# Patient Record
Sex: Male | Born: 1965 | Hispanic: Yes | Marital: Married | Smoking: Never smoker
Health system: Southern US, Community
[De-identification: ages and names within clinical notes are randomized; demographics above are authoritative.]

## PROBLEM LIST (undated history)

## (undated) DIAGNOSIS — E785 Hyperlipidemia, unspecified: Secondary | ICD-10-CM

## (undated) DIAGNOSIS — K449 Diaphragmatic hernia without obstruction or gangrene: Secondary | ICD-10-CM

## (undated) DIAGNOSIS — S62109A Fracture of unspecified carpal bone, unspecified wrist, initial encounter for closed fracture: Secondary | ICD-10-CM

## (undated) HISTORY — DX: Fracture of unspecified carpal bone, unspecified wrist, initial encounter for closed fracture: S62.109A

## (undated) HISTORY — PX: ESOPHAGOGASTRODUODENOSCOPY: SHX1529

## (undated) HISTORY — DX: Diaphragmatic hernia without obstruction or gangrene: K44.9

## (undated) HISTORY — PX: HAIR TRANSPLANT: SHX1719

---

## 2002-11-13 ENCOUNTER — Encounter: Payer: Self-pay | Admitting: Family Medicine

## 2007-07-12 ENCOUNTER — Ambulatory Visit: Payer: Self-pay | Admitting: Family Medicine

## 2007-07-12 DIAGNOSIS — R030 Elevated blood-pressure reading, without diagnosis of hypertension: Secondary | ICD-10-CM | POA: Insufficient documentation

## 2007-07-12 DIAGNOSIS — M25569 Pain in unspecified knee: Secondary | ICD-10-CM | POA: Insufficient documentation

## 2007-07-12 DIAGNOSIS — E7800 Pure hypercholesterolemia, unspecified: Secondary | ICD-10-CM | POA: Insufficient documentation

## 2007-07-19 ENCOUNTER — Encounter: Payer: Self-pay | Admitting: Family Medicine

## 2007-07-20 ENCOUNTER — Encounter: Payer: Self-pay | Admitting: Family Medicine

## 2007-07-20 LAB — CONVERTED CEMR LAB
ALT: 37 units/L (ref 0–53)
AST: 27 units/L (ref 0–37)
Albumin: 4.3 g/dL (ref 3.5–5.2)
Alkaline Phosphatase: 77 units/L (ref 39–117)
BUN: 9 mg/dL (ref 6–23)
CO2: 25 meq/L (ref 19–32)
Calcium: 9.3 mg/dL (ref 8.4–10.5)
Chloride: 103 meq/L (ref 96–112)
Creatinine, Ser: 0.8 mg/dL (ref 0.40–1.50)
HDL goal, serum: 40 mg/dL
LDL Cholesterol: 155 mg/dL — ABNORMAL HIGH (ref 0–99)
LDL Goal: 160 mg/dL
TSH: 1.164 microintl units/mL (ref 0.350–5.50)
Total CHOL/HDL Ratio: 5
Triglycerides: 160 mg/dL — ABNORMAL HIGH (ref <150)

## 2007-07-21 ENCOUNTER — Encounter: Admission: RE | Admit: 2007-07-21 | Discharge: 2007-07-21 | Payer: Self-pay | Admitting: Sports Medicine

## 2007-07-21 ENCOUNTER — Telehealth: Payer: Self-pay | Admitting: Family Medicine

## 2007-09-26 ENCOUNTER — Ambulatory Visit: Payer: Self-pay | Admitting: Family Medicine

## 2007-09-26 DIAGNOSIS — R7301 Impaired fasting glucose: Secondary | ICD-10-CM | POA: Insufficient documentation

## 2007-09-27 LAB — CONVERTED CEMR LAB
Albumin: 4.5 g/dL (ref 3.5–5.2)
Alkaline Phosphatase: 64 units/L (ref 39–117)
HDL: 46 mg/dL (ref >39)
Total Bilirubin: 0.3 mg/dL (ref 0.3–1.2)
Total CHOL/HDL Ratio: 3.5

## 2008-04-20 ENCOUNTER — Encounter: Payer: Self-pay | Admitting: Family Medicine

## 2008-05-10 ENCOUNTER — Encounter: Payer: Self-pay | Admitting: Family Medicine

## 2008-09-28 ENCOUNTER — Ambulatory Visit: Payer: Self-pay | Admitting: Family Medicine

## 2008-09-28 DIAGNOSIS — K2970 Gastritis, unspecified, without bleeding: Secondary | ICD-10-CM | POA: Insufficient documentation

## 2008-09-28 DIAGNOSIS — M25519 Pain in unspecified shoulder: Secondary | ICD-10-CM | POA: Insufficient documentation

## 2008-09-28 DIAGNOSIS — K2990 Gastroduodenitis, unspecified, without bleeding: Secondary | ICD-10-CM

## 2008-09-28 DIAGNOSIS — H6120 Impacted cerumen, unspecified ear: Secondary | ICD-10-CM | POA: Insufficient documentation

## 2008-09-29 ENCOUNTER — Encounter: Payer: Self-pay | Admitting: Family Medicine

## 2008-10-01 LAB — CONVERTED CEMR LAB
ALT: 14 units/L (ref 0–53)
Albumin: 4.6 g/dL (ref 3.5–5.2)
Alkaline Phosphatase: 71 units/L (ref 39–117)
BUN: 11 mg/dL (ref 6–23)
Calcium: 9.2 mg/dL (ref 8.4–10.5)
Cholesterol: 140 mg/dL (ref 0–200)
Creatinine, Ser: 0.81 mg/dL (ref 0.40–1.50)
Glucose, Bld: 99 mg/dL (ref 70–99)
LDL Cholesterol: 67 mg/dL (ref 0–99)
Potassium: 4.6 meq/L (ref 3.5–5.3)
Total Bilirubin: 0.6 mg/dL (ref 0.3–1.2)
Total Protein: 6.9 g/dL (ref 6.0–8.3)
Triglycerides: 147 mg/dL (ref <150)
VLDL: 29 mg/dL (ref 0–40)

## 2008-10-10 ENCOUNTER — Telehealth: Payer: Self-pay | Admitting: Family Medicine

## 2008-12-28 DIAGNOSIS — S62109A Fracture of unspecified carpal bone, unspecified wrist, initial encounter for closed fracture: Secondary | ICD-10-CM

## 2008-12-28 HISTORY — DX: Fracture of unspecified carpal bone, unspecified wrist, initial encounter for closed fracture: S62.109A

## 2009-07-04 ENCOUNTER — Ambulatory Visit: Payer: Self-pay | Admitting: Family Medicine

## 2009-07-05 LAB — CONVERTED CEMR LAB
ALT: 17 units/L (ref 0–53)
AST: 13 units/L (ref 0–37)
CO2: 27 meq/L (ref 19–32)
Calcium: 10 mg/dL (ref 8.4–10.5)
Cholesterol: 157 mg/dL (ref 0–200)
Creatinine, Ser: 0.86 mg/dL (ref 0.40–1.50)
HDL: 49 mg/dL (ref >39)
Potassium: 4.4 meq/L (ref 3.5–5.3)
Triglycerides: 112 mg/dL (ref <150)

## 2009-08-22 ENCOUNTER — Telehealth: Payer: Self-pay | Admitting: Family Medicine

## 2009-08-23 ENCOUNTER — Encounter: Payer: Self-pay | Admitting: Family Medicine

## 2009-08-26 LAB — CONVERTED CEMR LAB
AST: 14 units/L (ref 0–37)
Albumin: 4.7 g/dL (ref 3.5–5.2)
Alkaline Phosphatase: 60 units/L (ref 39–117)
CO2: 24 meq/L (ref 19–32)
Calcium: 9.8 mg/dL (ref 8.4–10.5)
Chloride: 102 meq/L (ref 96–112)
Creatinine, Ser: 0.87 mg/dL (ref 0.40–1.50)
Potassium: 4.3 meq/L (ref 3.5–5.3)
Sodium: 139 meq/L (ref 135–145)
Total CK: 87 units/L (ref 7–232)

## 2009-09-11 ENCOUNTER — Telehealth: Payer: Self-pay | Admitting: Family Medicine

## 2009-09-13 ENCOUNTER — Ambulatory Visit: Payer: Self-pay | Admitting: Family Medicine

## 2009-12-01 ENCOUNTER — Emergency Department (HOSPITAL_BASED_OUTPATIENT_CLINIC_OR_DEPARTMENT_OTHER): Admission: EM | Admit: 2009-12-01 | Discharge: 2009-12-01 | Payer: Self-pay | Admitting: Emergency Medicine

## 2009-12-01 ENCOUNTER — Ambulatory Visit: Payer: Self-pay | Admitting: Diagnostic Radiology

## 2010-01-24 ENCOUNTER — Encounter: Admission: RE | Admit: 2010-01-24 | Discharge: 2010-03-20 | Payer: Self-pay | Admitting: Orthopaedic Surgery

## 2010-02-26 ENCOUNTER — Ambulatory Visit: Payer: Self-pay | Admitting: Diagnostic Radiology

## 2010-02-26 ENCOUNTER — Ambulatory Visit (HOSPITAL_BASED_OUTPATIENT_CLINIC_OR_DEPARTMENT_OTHER): Admission: RE | Admit: 2010-02-26 | Discharge: 2010-02-26 | Payer: Self-pay | Admitting: Orthopaedic Surgery

## 2010-04-29 ENCOUNTER — Encounter: Payer: Self-pay | Admitting: Family Medicine

## 2010-05-17 ENCOUNTER — Encounter: Payer: Self-pay | Admitting: Family Medicine

## 2010-08-08 ENCOUNTER — Ambulatory Visit: Payer: Self-pay | Admitting: Family Medicine

## 2010-08-08 DIAGNOSIS — R972 Elevated prostate specific antigen [PSA]: Secondary | ICD-10-CM | POA: Insufficient documentation

## 2010-08-08 DIAGNOSIS — R5383 Other fatigue: Secondary | ICD-10-CM | POA: Insufficient documentation

## 2010-08-08 DIAGNOSIS — R5381 Other malaise: Secondary | ICD-10-CM | POA: Insufficient documentation

## 2010-08-08 LAB — CONVERTED CEMR LAB: Blood Glucose, AC Bkfst: 129 mg/dL

## 2010-08-11 LAB — CONVERTED CEMR LAB
Alkaline Phosphatase: 72 units/L (ref 39–117)
BUN: 12 mg/dL (ref 6–23)
Calcium: 10 mg/dL (ref 8.4–10.5)
Chloride: 101 meq/L (ref 96–112)
Cholesterol: 137 mg/dL (ref 0–200)
HDL: 46 mg/dL (ref >39)
LDL Cholesterol: 75 mg/dL (ref 0–99)
PSA: 2.88 ng/mL (ref 0.10–4.00)
Potassium: 4.8 meq/L (ref 3.5–5.3)
Sodium: 141 meq/L (ref 135–145)
Triglycerides: 78 mg/dL (ref <150)
Vitamin B-12: 485 pg/mL (ref 211–911)

## 2011-01-26 ENCOUNTER — Telehealth: Payer: Self-pay | Admitting: Family Medicine

## 2011-01-28 NOTE — Assessment & Plan Note (Signed)
Summary: DISCUSS sugar, lipids, joints   Vital Signs:  Patient profile:   45 year old male Height:      66.5 inches Weight:      146 pounds BMI:     23.30 Pulse rate:   60 / minute BP sitting:   114 / 80  (left arm) Cuff size:   regular  Vitals Entered By: Avon Gully CMA, Duncan Dull) (August 08, 2010 8:07 AM) CC: Discuss labs, pt sugar has been fluctuating between 90-108,wants to switch Lipitor to a generic   Primary Care Provider:  Linford Arnold, C  CC:  Discuss labs, pt sugar has been fluctuating between 90-108, and wants to switch Lipitor to a generic.  History of Present Illness: Discuss labs, pt sugar has been fluctuating between 90-108,wants to switch Lipitor to a generic.  Hasn't been as active after his wrist fracture.    Did PT on his right shoulder and right wrist  after hs wrist fracture and after having an MRI of his shoulder. He will get the results sent to [place]. Still having pain in his shoulder but says he has fallen off on doing his exercises onhis shoulder.   Has PSA checked during a free screening at [facility] and his level was initially.    Current Medications (verified): 1)  Accuchek Meter .... Strips To Test 1 X  A Day. Dx: Insulin Resistance. 2)  Lancets .... Enought To Check Sugar Once A Day 3)  Lipitor 40 Mg Tabs (Atorvastatin Calcium) .... Take 1 Tablet By Mouth Once A Day At Bedtime  Allergies (verified): 1)  Simvastatin (Simvastatin)  Comments:  Nurse/Medical Assistant: The patient's medications and allergies were reviewed with the patient and were updated in the Medication and Allergy Lists. Avon Gully CMA, Duncan Dull) (August 08, 2010 8:08 AM)  Past History:  Past Medical History: Hiatal hernia.  fractured wrist 2010  Physical Exam  General:  Well-developed,well-nourished,in no acute distress; alert,appropriate and cooperative throughout examination Head:  Normocephalic and atraumatic without obvious abnormalities. No apparent alopecia or  balding. Lungs:  Normal respiratory effort, chest expands symmetrically. Lungs are clear to auscultation, no crackles or wheezes. Heart:  Normal rate and regular rhythm. S1 and S2 normal without gallop, murmur, click, rub or other extra sounds. Skin:  no rashes.   Psych:  Cognition and judgment appear intact. Alert and cooperative with normal attention span and concentration. 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  Still in the prediabetic range. Continue to monitor and recheck in one year.  Orders: Fingerstick (16109) Glucose, (CBG) (60454) T-Comprehensive Metabolic Panel (09811-91478) Hemoglobin A1C (83036)  Problem # 2:  HYPERCHOLESTEROLEMIA, PURE (ICD-272.0) Will give him coupon card for lipitor as well. Due to check levels.  His updated medication list for this problem includes:    Lipitor 40 Mg Tabs (Atorvastatin calcium) .[name]... Take 1 tablet by mouth once a day at bedtime  Orders: T-Lipid Profile (29562-13086) T-Comprehensive Metabolic Panel (57846-96295)  Problem # 3:  PSA, INCREASED (ICD-790.93) Will recheck an make sure stable. Had a screening done at [facility] with level of 3.2.  Orders: T-PSA (28413-24401)  Complete Medication List: 1)  Accuchek Meter  .... Strips to test 1 x  a day. dx: insulin resistance. 2)  Lancets  .... Enought to check sugar once a day 3)  Lipitor 40 Mg Tabs (Atorvastatin calcium) .... Take 1 tablet by mouth once a day at bedtime  Other  Orders: T-Vitamin B12 [phone]) T-Vitamin D (25-Hydroxy) ([phone])  Patient Instructions: 1)  Glucosamine chondroitin for one month.  2)  We will call you with your labs.  3)  Please schedule a follow-up appointment in 1 year.  Prescriptions: LIPITOR 40 MG TABS (ATORVASTATIN CALCIUM) Take 1 tablet by mouth once a day at bedtime  #30 x 3   Entered by:   Kathlene November   Authorized by:    Nani Gasser MD   Signed by:   Kathlene November on 08/08/2010   Method used:   Electronically to        [facility] [phone]* (retail)       [address] [place], [place]  [address]       Ph: [phone] or [phone]       Fax: [phone]   RxID:   [id number]   Laboratory Results   Blood Tests   Date/Time Received: 08/08/10 Date/Time Reported: 08/08/10  HGBA1C: 6.0%   (Normal Range: Non-Diabetic - 3-6%   Control Diabetic - 6-8%) CBG Fasting:: 129mg /dL

## 2011-01-28 NOTE — Letter (Signed)
Summary: Care Consideration Regarding Diabetes & HbA1C Monitoring/[employer]  Care Consideration Regarding Diabetes & HbA1C Monitoring/[employer]   Imported By: Lanelle Bal 06/26/2010 09:07:34  _____________________________________________________________________  External Attachment:    Type:   Image     Comment:   External Document

## 2011-01-28 NOTE — Letter (Signed)
Summary: Prostate Screening/[facility]  Prostate Screening/[facility]   Imported By: Lanelle Bal 08/18/2010 12:54:28  _____________________________________________________________________  External Attachment:    Type:   Image     Comment:   External Document

## 2011-01-28 NOTE — Letter (Signed)
Summary: [facility]   Imported By: Lanelle Bal 06/25/2010 11:58:53  _____________________________________________________________________  External Attachment:    Type:   Image     Comment:   External Document

## 2011-01-28 NOTE — Miscellaneous (Signed)
Summary: Hep B/[facility]  Hep B/[facility]   Imported By: Lanelle Bal 06/25/2010 11:59:37  _____________________________________________________________________  External Attachment:    Type:   Image     Comment:   External Document

## 2011-02-04 NOTE — Progress Notes (Signed)
Summary: Liptor questions  Phone Note Call from Patient Call back at Home Phone [phone]   Caller: Patient Call For: Nani Gasser MD Reason for Call: Talk to Nurse Details for Reason: medication question Summary of Call: was taking lipitor, ran out in december, [facility] approved use of generic.  pt states he has history of elevated sugars, but has not been diagnosed as diabetic.  Pt read on internet that pt's with diabetes should not use Lipitor. If OK to take pt would like 90-day rx sent to [facility]. Also, pt would like take a lower dose if possible.  Advised pt I would send note to Dr. Linford Arnold adn we would return his call later with answers. Initial call taken by: Francee Piccolo CMA Duncan Dull),  January 26, 2011 2:37 PM  Follow-up for Phone Call        Will try dropping dose to 20mg  and then recheck chol in 4 months to make sure well vcontrolled onthe lower dose. Also there is no contraindication to diabetes and lipitor. We have tons of diabetics on lipitor.  Follow-up by: Nani Gasser MD,  January 26, 2011 4:49 PM  Additional Follow-up for Phone Call Additional follow up Details #1::        pt advised of the above.  He voices good understandign and is agreeable. Additional Follow-up by: Francee Piccolo CMA (AAMA),  January 27, 2011 11:12 AM    New/Updated Medications: LIPITOR 20 MG TABS (ATORVASTATIN CALCIUM) Take 1 tablet by mouth once a day at bedtime . Generic please. Prescriptions: LIPITOR 20 MG TABS (ATORVASTATIN CALCIUM) Take 1 tablet by mouth once a day at bedtime . Generic please.  #90 x 1   Entered and Authorized by:   Nani Gasser MD   Signed by:   Nani Gasser MD on 01/26/2011   Method used:   Electronically to        [facility] (retail)             ,          Ph: [phone]       Fax: [phone]   RxID:   [id number]

## 2011-08-17 ENCOUNTER — Telehealth: Payer: Self-pay | Admitting: *Deleted

## 2011-08-17 DIAGNOSIS — R030 Elevated blood-pressure reading, without diagnosis of hypertension: Secondary | ICD-10-CM

## 2011-08-17 DIAGNOSIS — E7800 Pure hypercholesterolemia, unspecified: Secondary | ICD-10-CM

## 2011-08-17 DIAGNOSIS — R972 Elevated prostate specific antigen [PSA]: Secondary | ICD-10-CM

## 2011-08-17 DIAGNOSIS — E348 Other specified endocrine disorders: Secondary | ICD-10-CM

## 2011-08-17 NOTE — Telephone Encounter (Signed)
Faxed orders to lab. KJ LPN

## 2011-08-17 NOTE — Telephone Encounter (Signed)
Pt coming in to see you at end of week. Would like the lab order sent to lab for him to get routine labs, cholesterol, sugar, etc checked before he comes for the appt. Pt will go tomorrow and have these done

## 2011-08-17 NOTE — Telephone Encounter (Signed)
Labs slip printed. Ok to fax.

## 2011-08-18 LAB — LIPID PANEL
LDL Cholesterol: 82 mg/dL (ref 0–99)
Total CHOL/HDL Ratio: 3.1 Ratio
VLDL: 19 mg/dL (ref 0–40)

## 2011-08-18 LAB — PSA: PSA: 2.69 ng/mL (ref <=4.00)

## 2011-08-19 ENCOUNTER — Encounter: Payer: Self-pay | Admitting: Family Medicine

## 2011-08-19 ENCOUNTER — Telehealth: Payer: Self-pay | Admitting: Family Medicine

## 2011-08-19 LAB — COMPLETE METABOLIC PANEL WITH GFR
AST: 16 U/L (ref 0–37)
Albumin: 4.4 g/dL (ref 3.5–5.2)
Alkaline Phosphatase: 60 U/L (ref 39–117)
CO2: 26 mEq/L (ref 19–32)
Chloride: 104 mEq/L (ref 96–112)
GFR, Est African American: >60 mL/min (ref >60)
Glucose, Bld: 100 mg/dL — ABNORMAL HIGH (ref 70–99)
Total Bilirubin: 0.5 mg/dL (ref 0.3–1.2)
Total Protein: 6.6 g/dL (ref 6.0–8.3)

## 2011-08-19 NOTE — Telephone Encounter (Signed)
Left message on pt.'s vm.

## 2011-08-19 NOTE — Telephone Encounter (Signed)
Call pt: CMP looks good. Sugar is much better. It was 100. Was 111 before. Lipid panel was nl. PSA is nl.

## 2011-08-20 ENCOUNTER — Telehealth: Payer: Self-pay | Admitting: Family Medicine

## 2011-08-20 NOTE — Telephone Encounter (Signed)
Pt wants a copy of his recent lab results mailed to him. PLan:  Mailed George Newcomer, LPN Domingo Dimes

## 2011-08-21 ENCOUNTER — Ambulatory Visit (INDEPENDENT_AMBULATORY_CARE_PROVIDER_SITE_OTHER): Payer: BC Managed Care – PPO | Admitting: Family Medicine

## 2011-08-21 ENCOUNTER — Other Ambulatory Visit: Payer: Self-pay | Admitting: Family Medicine

## 2011-08-21 ENCOUNTER — Encounter: Payer: Self-pay | Admitting: Family Medicine

## 2011-08-21 VITALS — BP 123/76 | HR 75 | Wt 151.0 lb

## 2011-08-21 DIAGNOSIS — R5383 Other fatigue: Secondary | ICD-10-CM

## 2011-08-21 DIAGNOSIS — E7800 Pure hypercholesterolemia, unspecified: Secondary | ICD-10-CM

## 2011-08-21 DIAGNOSIS — R5381 Other malaise: Secondary | ICD-10-CM

## 2011-08-21 DIAGNOSIS — E348 Other specified endocrine disorders: Secondary | ICD-10-CM

## 2011-08-21 DIAGNOSIS — R030 Elevated blood-pressure reading, without diagnosis of hypertension: Secondary | ICD-10-CM

## 2011-08-21 LAB — VITAMIN B12: Vitamin B-12: 359 pg/mL (ref 211–911)

## 2011-08-21 MED ORDER — ROSUVASTATIN CALCIUM 5 MG PO TABS: 5.0000 mg | ORAL_TABLET | Freq: Every day | ORAL | Status: DC

## 2011-08-21 NOTE — Patient Instructions (Signed)
Recheck your cholesterol in about 2 months. Can call for the lab slip.

## 2011-08-21 NOTE — Assessment & Plan Note (Addendum)
Sugar at 100. Recheck at 1 yr. Much imprvoed.

## 2011-08-22 NOTE — Assessment & Plan Note (Signed)
BP looks great today. Congratulated him on his lifestyle changes.

## 2011-08-22 NOTE — Progress Notes (Signed)
  Subjective:    Patient ID: George Young, male    DOB: [birth year], 45 y.o.   MRN: [id number]  Hyperlipidemia This is a chronic problem. The problem is controlled. Pertinent negatives include no chest pain or shortness of breath. Current antihyperlipidemic treatment includes statins. The current treatment provides significant improvement of lipids. There are no compliance problems.  Risk factors for coronary artery disease include male sex.   Here to review his lab results and to check his BP which has been borderline elevated in teh past. Says he has really made some dietary changes but hasn't been working out as much lately. He has been more fatigued lately and says would like to have his irno and B12 checked. He says these have been borderline low in the past.    Review of Systems  Respiratory: Negative for shortness of breath.   Cardiovascular: Negative for chest pain.       Objective:   Physical Exam  Constitutional: He is oriented to person, place, and time. He appears well-developed and well-nourished.  HENT:  Head: Normocephalic and atraumatic.  Cardiovascular: Normal rate, regular rhythm and normal heart sounds.   Pulmonary/Chest: Effort normal and breath sounds normal.  Musculoskeletal: He exhibits no edema.  Neurological: He is alert and oriented to person, place, and time.  Skin: Skin is warm and dry.  Psychiatric: He has a normal mood and affect. His behavior is normal.          Assessment & Plan:

## 2011-08-22 NOTE — Assessment & Plan Note (Addendum)
REviewed his results. Improved. Will try dec dose to 5 mg and see if still well controlled.

## 2011-08-24 ENCOUNTER — Telehealth: Payer: Self-pay | Admitting: Family Medicine

## 2011-08-24 NOTE — Telephone Encounter (Signed)
Pt notified of results. Pt not currently taking Vitamin B12. Instructed pt to start OTC Vitamin B12 daily. Pt verbalized understanding.KJ LPN

## 2011-08-24 NOTE — Telephone Encounter (Signed)
Call pt: Vitamin B12 is normal but on the low end. Consider taking daily Vit B12. If already on it then let me know.

## 2011-09-16 ENCOUNTER — Telehealth: Payer: Self-pay | Admitting: *Deleted

## 2011-09-16 NOTE — Telephone Encounter (Signed)
Tell him I apologize. We had 10mg  of crestor in the computer so I calledin 5 mg of crestor. Yest we can try switching to 10 mg of lipitor. I will send a new rx to [facility]

## 2011-09-16 NOTE — Telephone Encounter (Signed)
Pt called and states he did receive rx from [facility] for 5 mg of Crestor. His dose was to be decreased since chol had improved however pt was taking 20 mg of Lipitor and was wondering if it was to be decreased to 10 mg since it was discussed that the dose would be decreased by half. Pt would rather have Lipitor because its cheaper than Crestor. Please advise

## 2011-09-17 MED ORDER — ATORVASTATIN CALCIUM 10 MG PO TABS: 10.0000 mg | ORAL_TABLET | Freq: Every day | ORAL | Status: DC

## 2011-09-17 NOTE — Telephone Encounter (Signed)
Pt notified and samples up front of Lipitor 10 mg

## 2011-09-24 ENCOUNTER — Telehealth: Payer: Self-pay | Admitting: *Deleted

## 2011-09-24 NOTE — Telephone Encounter (Signed)
The Lipitor will cost him 30.00 but he has not got it yet he told [facility] to hold it.

## 2011-09-24 NOTE — Telephone Encounter (Signed)
Pt calls and states that [facility] sent the Crestor to his home and he has a bill for it of 100.00. Says that you sent the wrong ed into them and it should have been Lipitor. [facility] did receive the Lipitor rx as well but pt told them to hold it until he talked with you because he does not want to pay for the Crestor when it was the wrong med sent and they will not accept the med back. Explained to pt that I would discuss with you and see what you wanted to do. Pt was nice about the situation but really does not want to pay for it since he did not order it and it is the wrong med. Call pt back at [phone]

## 2011-09-24 NOTE — Telephone Encounter (Signed)
Can you find out the price difference between what he paid for the crestor and the lipitor?  Maybe we can pay him back the difference. I can talk with Toniann Fail. He can still use the crestor since they won't take it back. Might as well, and that will last him 6 months.

## 2011-09-29 NOTE — Telephone Encounter (Signed)
Spoke with Toniann Fail about the situation and per her instruction informed pt to bring the bill to her and she would pay it due to it was our office mistake. Pt agrees and will bring bill

## 2011-11-20 ENCOUNTER — Emergency Department
Admission: EM | Admit: 2011-11-20 | Discharge: 2011-11-20 | Disposition: A | Discharge status: Home / Self Care | Payer: BC Managed Care – PPO | Source: Home / Self Care | Attending: Family Medicine | Admitting: Family Medicine

## 2011-11-20 ENCOUNTER — Encounter: Payer: Self-pay | Admitting: *Deleted

## 2011-11-20 DIAGNOSIS — J069 Acute upper respiratory infection, unspecified: Secondary | ICD-10-CM

## 2011-11-20 DIAGNOSIS — J029 Acute pharyngitis, unspecified: Secondary | ICD-10-CM

## 2011-11-20 HISTORY — DX: Hyperlipidemia, unspecified: E78.5

## 2011-11-20 MED ORDER — BENZONATATE 200 MG PO CAPS: 200.0000 mg | ORAL_CAPSULE | Freq: Every day | ORAL | Status: AC

## 2011-11-20 MED ORDER — AZITHROMYCIN 250 MG PO TABS: ORAL_TABLET | ORAL | Status: AC

## 2011-11-20 NOTE — ED Notes (Signed)
Pt c/o sore throat and head congestion x 5 days. He also c/o bilateral ear congestion. No fever. He has taken OTC cold/ flu med and mucinex with no relief.

## 2011-11-20 NOTE — ED Provider Notes (Signed)
History     CSN: [id number] Arrival date & time: 11/20/2011  8:39 AM   First MD Initiated Contact with Patient 11/20/11 [phone]      Chief Complaint  Patient presents with  . Sore Throat     HPI Comments: Patient complains of approximately 6 day history of gradually progressive URI symptoms beginning with a mild sore throat that has persisted, followed by progressive nasal congestion.  A cough started about 2 days ago.  Complains of fatigue but no myalgias.  Cough is now worse at night and generally non-productive during the day.  There has been no pleuritic pain, shortness of breath, or wheezes.   Patient is a 45 y.o. male presenting with URI. The history is provided by the patient.  URI The primary symptoms include fatigue, headaches, ear pain, sore throat and cough. Primary symptoms do not include fever, swollen glands, wheezing, abdominal pain, nausea, vomiting or myalgias. The current episode started 6 to 7 days ago. This is a new problem.  The headache is not associated with neck stiffness.  Symptoms associated with the illness include plugged ear sensation, congestion and rhinorrhea. The illness is not associated with chills, facial pain or sinus pressure. The following treatments were addressed: A decongestant was ineffective.    Past Medical History  Diagnosis Date  . Hiatal hernia   . Wrist fracture 2010  . Hyperlipemia     History reviewed. No pertinent past surgical history.  Family History  Problem Relation Age of Onset  . Hyperlipidemia Mother   . Heart disease Mother     Has pacemaker  . Irregular heart beat Mother     pacemaker  . Hypertension Father     History  Substance Use Topics  . Smoking status: Never Smoker   . Smokeless tobacco: Not on file  . Alcohol Use: Yes     2 per mth      Review of Systems  Constitutional: Positive for activity change and fatigue. Negative for fever, chills and appetite change.  HENT: Positive for ear pain,  congestion, sore throat, rhinorrhea and postnasal drip. Negative for facial swelling, neck stiffness, sinus pressure and ear discharge.   Eyes: Negative.   Respiratory: Positive for cough. Negative for shortness of breath and wheezing.   Cardiovascular: Negative.   Gastrointestinal: Negative.  Negative for nausea, vomiting and abdominal pain.  Genitourinary: Negative.   Musculoskeletal: Negative.  Negative for myalgias.  Skin: Negative.   Neurological: Positive for headaches.    Allergies  Simvastatin  Home Medications   Current Outpatient Rx  Name Route Sig Dispense Refill  . ACCU-CHEK FASTCLIX LANCETS MISC       . ATORVASTATIN CALCIUM 10 MG PO TABS Oral Take 1 tablet (10 mg total) by mouth daily. 90 tablet 0  . AZITHROMYCIN 250 MG PO TABS  Take 2 tabs today; then begin one tab once daily for 4 more days. (Rx void after 11/27/11) 6 each 0  . BENZONATATE 200 MG PO CAPS Oral Take 1 capsule (200 mg total) by mouth at bedtime. Take as needed for cough 12 capsule 0  . ACCU-CHEK ADVANTAGE DIABETES KIT  Use as instructed       BP 134/86  Pulse 76  Temp(Src) 98.3 F (36.8 C) (Oral)  Resp 18  Ht 5\' 6"  (1.676 m)  Wt 153 lb 8 oz (69.627 kg)  BMI 24.78 kg/m2  SpO2 99%  Physical Exam  Nursing note and vitals reviewed. Constitutional: He is oriented to person, place,  and time. He appears well-developed and well-nourished. No distress.  HENT:  Head: Normocephalic.  Right Ear: External ear normal.  Left Ear: External ear normal.  Nose: Nose normal.  Mouth/Throat: Oropharynx is clear and moist. No oropharyngeal exudate.  Eyes: Conjunctivae and EOM are normal. Pupils are equal, round, and reactive to light. Right eye exhibits no discharge. Left eye exhibits no discharge.  Neck: Normal range of motion. Neck supple.       Shotty posterior cervical nodes are tender.   Cardiovascular: Normal rate, regular rhythm and normal heart sounds.   Pulmonary/Chest: Effort normal and breath sounds  normal. No stridor. No respiratory distress. He has no wheezes. He has no rales. He exhibits no tenderness.  Abdominal: Soft. Bowel sounds are normal. He exhibits no distension. There is no tenderness.  Musculoskeletal: He exhibits no edema.  Lymphadenopathy:    He has cervical adenopathy.  Neurological: He is alert and oriented to person, place, and time.  Skin: Skin is warm and dry. He is not diaphoretic.    ED Course  Procedures none   Labs Reviewed  POCT RAPID STREP A (OFFICE) negative  STREP A DNA PROBE pending      1. Acute upper respiratory infections of unspecified site   2. Acute pharyngitis       MDM  No evidence bacterial infection today.  Throat culture pending.  Treat symptomatically for now: Take Mucinex D (guaifenesin with decongestant) twice daily for congestion.  Increase fluid intake, rest. May use Afrin nasal spray (or generic oxymetazoline) twice daily for about 5 days.  Also recommend using saline nasal spray several times daily and/or saline nasal irrigation. Stop all antihistamines for now, and other non-prescription cough/cold preparations. May take Ibuprofen 200mg , 4 tabs every 8 hours with food for sore throat Begin Azithromycin if not improving about 5 days or if persistent fever develops. Follow-up with family doctor if not improving 7 to 10 days.        Donna Christen, MD 11/22/11 [phone]

## 2011-11-23 ENCOUNTER — Encounter: Payer: Self-pay | Admitting: Emergency Medicine

## 2011-11-23 ENCOUNTER — Other Ambulatory Visit: Payer: Self-pay | Admitting: *Deleted

## 2011-11-23 MED ORDER — ATORVASTATIN CALCIUM 10 MG PO TABS: 10.0000 mg | ORAL_TABLET | Freq: Every day | ORAL | Status: DC

## 2012-03-15 ENCOUNTER — Other Ambulatory Visit: Payer: Self-pay | Admitting: *Deleted

## 2012-03-15 MED ORDER — ATORVASTATIN CALCIUM 10 MG PO TABS: 10.0000 mg | ORAL_TABLET | Freq: Every day | ORAL | Status: DC

## 2012-11-11 ENCOUNTER — Ambulatory Visit (INDEPENDENT_AMBULATORY_CARE_PROVIDER_SITE_OTHER): Payer: BC Managed Care – PPO | Admitting: Family Medicine

## 2012-11-11 ENCOUNTER — Encounter: Payer: Self-pay | Admitting: Family Medicine

## 2012-11-11 VITALS — BP 123/75 | HR 67 | Ht 66.0 in | Wt 157.0 lb

## 2012-11-11 DIAGNOSIS — R5383 Other fatigue: Secondary | ICD-10-CM

## 2012-11-11 DIAGNOSIS — Z0000 Encounter for general adult medical examination without abnormal findings: Secondary | ICD-10-CM

## 2012-11-11 DIAGNOSIS — R5381 Other malaise: Secondary | ICD-10-CM

## 2012-11-11 DIAGNOSIS — K449 Diaphragmatic hernia without obstruction or gangrene: Secondary | ICD-10-CM

## 2012-11-11 DIAGNOSIS — R7301 Impaired fasting glucose: Secondary | ICD-10-CM

## 2012-11-11 NOTE — Progress Notes (Signed)
Subjective:    Patient ID: George Young, male    DOB: Jul 10, 1966, 46 y.o.   MRN: [id number]  HPI Here for CPE today. No specific complaints. He recently has gone back to the gym because he has gained some weight the last year. He does continue to have some pain in his right foot with heat which she admits to previously.   Review of Systems Comprehensive review of systems is negative.  BP 123/75  Pulse 67  Ht 5\' 6"  (1.676 m)  Wt 157 lb (71.215 kg)  BMI 25.34 kg/m2    Allergies  Allergen Reactions  . Simvastatin     REACTION: Myalgias    Past Medical History  Diagnosis Date  . Hiatal hernia   . Wrist fracture 2010  . Hyperlipemia     Past Surgical History  Procedure Date  . Esophagogastroduodenoscopy     History   Social History  . Marital Status: Married    Spouse Name: N/A    Number of Children: N/A  . Years of Education: N/A   Occupational History  . Not on file.   Social History Main Topics  . Smoking status: Never Smoker   . Smokeless tobacco: Not on file  . Alcohol Use: Yes     Comment: 2 per mth  . Drug Use: No  . Sexually Active:    Other Topics Concern  . Not on file   Social History Narrative  . No narrative on file    Family History  Problem Relation Age of Onset  . Hyperlipidemia Mother   . Heart disease Mother     Has pacemaker  . Irregular heart beat Mother     pacemaker  . Hypertension Father     Outpatient Encounter Prescriptions as of 11/11/2012  Medication Sig Dispense Refill  . ACCU-CHEK FASTCLIX LANCETS MISC        . atorvastatin (LIPITOR) 10 MG tablet Take 1 tablet (10 mg total) by mouth daily.  90 tablet  2  . Blood Glucose Monitoring Suppl (ACCU-CHEK ADVANTAGE DIABETES) kit Use as instructed               Objective:   Physical Exam  Constitutional: He is oriented to person, place, and time. He appears well-developed and well-nourished.  HENT:  Head: Normocephalic and atraumatic.  Right Ear: External ear  normal.  Left Ear: External ear normal.  Nose: Nose normal.  Mouth/Throat: Oropharynx is clear and moist.  Eyes: Conjunctivae normal and EOM are normal. Pupils are equal, round, and reactive to light.  Neck: Normal range of motion. Neck supple. No thyromegaly present.  Cardiovascular: Normal rate, regular rhythm, normal heart sounds and intact distal pulses.        No carotid or abdominal bruits.  Pulmonary/Chest: Effort normal and breath sounds normal.  Abdominal: Soft. Bowel sounds are normal. He exhibits no distension and no mass. There is no tenderness. There is no rebound and no guarding.  Musculoskeletal: Normal range of motion. He exhibits no edema.  Lymphadenopathy:    He has no cervical adenopathy.  Neurological: He is alert and oriented to person, place, and time. He has normal reflexes.  Skin: Skin is warm and dry.  Psychiatric: He has a normal mood and affect. His behavior is normal. Judgment and thought content normal.          Assessment & Plan:  CPE -  Keep up a regular exercise program and make sure you are eating a healthy diet Try  to eat 4 servings of dairy a day, or if you are lactose intolerant take a calcium with vitamin D daily.  Your vaccines are up to date.  CMP and lipids Screening Encouraged him to get back to the gym.   Foot pain - Recommend see Dr. Karie Schwalbe if he would like.    He requests to be tested for anemia and B12 deficiency as he has felt more fatigued recently. We will also check his thyroid level.  Elevated prostate level. Recheck this time.

## 2012-11-11 NOTE — Patient Instructions (Addendum)
Keep up a regular exercise program and make sure you are eating a healthy diet Try to eat 4 servings of dairy a day, or if you are lactose intolerant take a calcium with vitamin D daily.  Your vaccines are up to date.   

## 2012-11-14 LAB — CBC
Hemoglobin: 15.5 g/dL (ref 13.0–17.0)
MCH: 30.8 pg (ref 26.0–34.0)
MCHC: 33.8 g/dL (ref 30.0–36.0)
MCV: 91.1 fL (ref 78.0–100.0)
Platelets: 261 10*3/uL (ref 150–400)
RBC: 5.04 MIL/uL (ref 4.22–5.81)
RDW: 13.5 % (ref 11.5–15.5)
WBC: 6 10*3/uL (ref 4.0–10.5)

## 2012-11-14 LAB — TSH: TSH: 1.205 u[IU]/mL (ref 0.350–4.500)

## 2012-11-14 LAB — LIPID PANEL: LDL Cholesterol: 119 mg/dL — ABNORMAL HIGH (ref 0–99)

## 2012-11-15 LAB — COMPLETE METABOLIC PANEL WITH GFR
AST: 16 U/L (ref 0–37)
Alkaline Phosphatase: 57 U/L (ref 39–117)
CO2: 30 mEq/L (ref 19–32)
Chloride: 101 mEq/L (ref 96–112)
Glucose, Bld: 106 mg/dL — ABNORMAL HIGH (ref 70–99)
Potassium: 4.4 mEq/L (ref 3.5–5.3)
Total Bilirubin: 0.4 mg/dL (ref 0.3–1.2)

## 2013-03-07 ENCOUNTER — Other Ambulatory Visit: Payer: Self-pay | Admitting: Family Medicine

## 2013-08-28 HISTORY — PX: LASIK: SHX215

## 2013-11-02 ENCOUNTER — Other Ambulatory Visit: Payer: Self-pay

## 2013-11-03 ENCOUNTER — Ambulatory Visit (INDEPENDENT_AMBULATORY_CARE_PROVIDER_SITE_OTHER): Payer: BC Managed Care – PPO | Admitting: Family Medicine

## 2013-11-03 ENCOUNTER — Ambulatory Visit (INDEPENDENT_AMBULATORY_CARE_PROVIDER_SITE_OTHER): Payer: BC Managed Care – PPO

## 2013-11-03 ENCOUNTER — Other Ambulatory Visit: Payer: Self-pay | Admitting: *Deleted

## 2013-11-03 ENCOUNTER — Encounter: Payer: Self-pay | Admitting: Sports Medicine

## 2013-11-03 ENCOUNTER — Encounter: Payer: Self-pay | Admitting: Family Medicine

## 2013-11-03 ENCOUNTER — Ambulatory Visit (INDEPENDENT_AMBULATORY_CARE_PROVIDER_SITE_OTHER): Payer: BC Managed Care – PPO | Admitting: Sports Medicine

## 2013-11-03 VITALS — BP 127/76 | HR 70 | Wt 157.0 lb

## 2013-11-03 VITALS — BP 127/76 | HR 70 | Temp 98.0°F | Ht 66.0 in | Wt 156.0 lb

## 2013-11-03 DIAGNOSIS — M25569 Pain in unspecified knee: Secondary | ICD-10-CM

## 2013-11-03 DIAGNOSIS — M25561 Pain in right knee: Secondary | ICD-10-CM

## 2013-11-03 DIAGNOSIS — J309 Allergic rhinitis, unspecified: Secondary | ICD-10-CM

## 2013-11-03 DIAGNOSIS — M6740 Ganglion, unspecified site: Secondary | ICD-10-CM

## 2013-11-03 DIAGNOSIS — E348 Other specified endocrine disorders: Secondary | ICD-10-CM

## 2013-11-03 DIAGNOSIS — R040 Epistaxis: Secondary | ICD-10-CM

## 2013-11-03 DIAGNOSIS — Z0000 Encounter for general adult medical examination without abnormal findings: Secondary | ICD-10-CM

## 2013-11-03 LAB — COMPLETE METABOLIC PANEL WITH GFR
ALT: 19 U/L (ref 0–53)
Albumin: 4.3 g/dL (ref 3.5–5.2)
Chloride: 101 mEq/L (ref 96–112)
GFR, Est Non African American: >89 mL/min
Potassium: 4.4 mEq/L (ref 3.5–5.3)
Sodium: 139 mEq/L (ref 135–145)

## 2013-11-03 LAB — CBC
HCT: 44.7 % (ref 39.0–52.0)
MCV: 88.5 fL (ref 78.0–100.0)
Platelets: 265 10*3/uL (ref 150–400)
WBC: 6.2 10*3/uL (ref 4.0–10.5)

## 2013-11-03 LAB — LIPID PANEL
LDL Cholesterol: 92 mg/dL (ref 0–99)
Total CHOL/HDL Ratio: 3.8 Ratio
VLDL: 22 mg/dL (ref 0–40)

## 2013-11-03 LAB — POCT GLYCOSYLATED HEMOGLOBIN (HGB A1C): Hemoglobin A1C: 5.6

## 2013-11-03 LAB — PSA: PSA: 3.03 ng/mL (ref <=4.00)

## 2013-11-03 LAB — TSH: TSH: 1.818 u[IU]/mL (ref 0.350–4.500)

## 2013-11-03 MED ORDER — NITROGLYCERIN 0.2 MG/HR TD PT24: MEDICATED_PATCH | TRANSDERMAL | Status: DC

## 2013-11-03 MED ORDER — CETIRIZINE HCL 10 MG PO TABS: 10.0000 mg | ORAL_TABLET | Freq: Every day | ORAL | Status: DC

## 2013-11-03 MED ORDER — FLUTICASONE PROPIONATE 50 MCG/ACT NA SUSP: 2.0000 | Freq: Every day | NASAL | Status: DC

## 2013-11-03 MED ORDER — ATORVASTATIN CALCIUM 10 MG PO TABS: ORAL_TABLET | ORAL | Status: DC

## 2013-11-03 MED ORDER — MELOXICAM 15 MG PO TABS: ORAL_TABLET | ORAL | Status: DC

## 2013-11-03 NOTE — Assessment & Plan Note (Signed)
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 We will start conservatively with physical therapy, Cho-Pat Strap, meloxicam, nitroglycerin, x-rays. Return in 4 weeks

## 2013-11-03 NOTE — Patient Instructions (Signed)
Keep up a regular exercise program and make sure you are eating a healthy diet Try to eat 4 servings of dairy a day, or if you are lactose intolerant take a calcium with vitamin D daily.  Your vaccines are up to date.   

## 2013-11-03 NOTE — Assessment & Plan Note (Signed)
Left foot, feels to be closely attached to the flexor hallucis longus tendon sheath. At the next visit we can consider an aspiration and injection under guidance.

## 2013-11-03 NOTE — Addendum Note (Signed)
Addended by: Deno Etienne on: 11/03/2013 09:21 AM   Modules accepted: Orders

## 2013-11-03 NOTE — Progress Notes (Signed)
Subjective:    Patient ID: George Young, male    DOB: [birth year], 47 y.o.   MRN: [id number]  HPI Here for CPE today.  Says BP has been up the last cuple of weeks. Did have a nose bleed. No soreness in the nose.  Feels like can't smell well and wonders if has polyps.  Still some nasal congestion. Snoring a lot.  Taking and OTC allergy med.     Review of Systems Conference review of systems is negative.  BP 127/76  Pulse 70  Temp(Src) 98 F (36.7 C)  Ht 5\' 6"  (1.676 m)  Wt 156 lb (70.761 kg)  BMI 25.19 kg/m2    Allergies  Allergen Reactions  . Simvastatin     REACTION: Myalgias    Past Medical History  Diagnosis Date  . Hiatal hernia   . Wrist fracture 2010  . Hyperlipemia     Past Surgical History  Procedure Laterality Date  . Esophagogastroduodenoscopy    . Lasik  08/2013    Right    History   Social History  . Marital Status: Married    Spouse Name: N/A    Number of Children: N/A  . Years of Education: N/A   Occupational History  . Not on file.   Social History Main Topics  . Smoking status: Never Smoker   . Smokeless tobacco: Not on file  . Alcohol Use: Yes     Comment: 2 per mth  . Drug Use: No  . Sexual Activity:    Other Topics Concern  . Not on file   Social History Narrative   Some exercise.      Family History  Problem Relation Age of Onset  . Hyperlipidemia Mother   . Heart disease Mother     Has pacemaker  . Irregular heart beat Mother     pacemaker  . Hypertension Father     Outpatient Encounter Prescriptions as of 11/03/2013  Medication Sig  . ACCU-CHEK FASTCLIX LANCETS MISC    . atorvastatin (LIPITOR) 10 MG tablet TAKE 1 TABLET DAILY  . Blood Glucose Monitoring Suppl (ACCU-CHEK ADVANTAGE DIABETES) kit Use as instructed           Objective:   Physical Exam  Constitutional: He is oriented to person, place, and time. He appears well-developed and well-nourished.  HENT:  Head: Normocephalic and atraumatic.  Right Ear:  External ear normal.  Left Ear: External ear normal.  Nose: Nose normal.  Mouth/Throat: Oropharynx is clear and moist.  No nasal ulcerations or polyps. He does have swollen boggy turbinates, worse on the right.  Eyes: Conjunctivae and EOM are normal. Pupils are equal, round, and reactive to light.  Neck: Normal range of motion. Neck supple. No thyromegaly present.  Cardiovascular: Normal rate, regular rhythm, normal heart sounds and intact distal pulses.   Pulmonary/Chest: Effort normal and breath sounds normal.  Abdominal: Soft. Bowel sounds are normal. He exhibits no distension and no mass. There is no tenderness. There is no rebound and no guarding.  Musculoskeletal: Normal range of motion.  Lymphadenopathy:    He has no cervical adenopathy.  Neurological: He is alert and oriented to person, place, and time. He has normal reflexes.  Skin: Skin is warm and dry.  Psychiatric: He has a normal mood and affect. His behavior is normal. Judgment and thought content normal.          Assessment & Plan:  CPE -  Keep up a regular exercise program and make sure  you are eating a healthy diet Try to eat 4 servings of dairy a day, or if you are lactose intolerant take a calcium with vitamin D daily.  Your vaccines are up to date.   AR - doing well over all on his over-the-counter allergy medication but is still having some residual symptoms. I did not see any polyps or nasal ulcerations on exam so will add a nasal steroid spray. The she continues to have nosebleeds then please let me know. Also discussed ways to moisturize her nasal passages and this may help as well.  Impaired fasting glucose-stable with a hemoglobin A1c of 5.9 today. Repeat in 6 months.

## 2013-11-03 NOTE — Progress Notes (Signed)
   Subjective:    I'm seeing this patient as a consultation for:  Dr. Linford Arnold  CC: right knee pain  HPI: Pt presents to clinic for knee pain for about 3 weeks. Pt complains of pressure and pain, worse with sitting for a long time and particularly when walking downstairs. It has gradually worsened. Pain is described as a "stretching or pulling" pain and "sharp." At its worst the pain is 6/10; right now at rest, the pain is 0/10. Pt has noticed no swelling, redness/warmth in the area. Pt has not tried any particular therapies; he has not done any particular exercises or taken any OTC meds. Pt states he has a history of "tendinitis" in the right knee "years ago," with similar symptoms. He denies greatly increased activity or definite injury or fall. He goes to the gym about twice per week and uses ellipical machines and so on but does not do dead weight lifting. Pt states he previously played soccer about once a week about a year ago, but has not played in 4-5 months.  Past medical history, Surgical history, Family history not pertinant except as noted below, Social history, Allergies, and medications have been entered into the medical record, reviewed, and no changes needed.   Review of Systems: No headache, visual changes, nausea, vomiting, diarrhea, constipation, dizziness, abdominal pain, skin rash, fevers, chills, night sweats, weight loss, swollen lymph nodes, body aches, joint swelling, muscle aches, chest pain, shortness of breath, mood changes, visual or auditory hallucinations.   Objective:   General: Well Developed, well nourished, and in no acute distress.  Neuro/Psych: Alert and oriented x3, extra-ocular muscles intact, able to move all 4 extremities, sensation grossly intact. Skin: Warm and dry, no rashes noted.  Respiratory: Not using accessory muscles, speaking in full sentences, trachea midline.  Cardiovascular: Pulses palpable, no extremity edema. Abdomen: Does not appear  distended. MSK: negative for swelling/effusion of bilateral knees, no tenderness along joint lines  Right knee with firm nodularity inferior to patella on tibial plateau, nontender, non-mobile  No joint laxity on valgus or varus stress for either knee, negative drawer tests bilaterally  Able to bear weight and ambulate, sit/stand without assistance or   Mild crepitus with passive ROM and patellar compression, bilaterally  Incidentally noted mobile half-centimeter cystlike nodule along great toe flexor tendon on left  X-rays were personally reviewed and do show some patellofemoral degenerative changes.  Impression and Recommendations:   This case required medical decision making of moderate complexity.

## 2013-11-04 LAB — VITAMIN D 25 HYDROXY (VIT D DEFICIENCY, FRACTURES): Vit D, 25-Hydroxy: 35 ng/mL (ref 30–89)

## 2013-11-06 ENCOUNTER — Telehealth: Payer: Self-pay | Admitting: *Deleted

## 2013-11-06 NOTE — Telephone Encounter (Signed)
Pt's biometric screening form faxed and scanned.Loralee [place]

## 2013-11-13 ENCOUNTER — Ambulatory Visit: Payer: BC Managed Care – PPO | Admitting: Physical Therapy

## 2013-11-16 ENCOUNTER — Ambulatory Visit: Payer: BC Managed Care – PPO | Admitting: Physical Therapy

## 2013-11-16 DIAGNOSIS — M6281 Muscle weakness (generalized): Secondary | ICD-10-CM

## 2013-11-16 DIAGNOSIS — M25569 Pain in unspecified knee: Secondary | ICD-10-CM

## 2013-11-27 ENCOUNTER — Encounter: Payer: BC Managed Care – PPO | Admitting: Physical Therapy

## 2013-11-27 DIAGNOSIS — M6281 Muscle weakness (generalized): Secondary | ICD-10-CM

## 2013-11-27 DIAGNOSIS — M25569 Pain in unspecified knee: Secondary | ICD-10-CM

## 2013-11-27 DIAGNOSIS — M25579 Pain in unspecified ankle and joints of unspecified foot: Secondary | ICD-10-CM

## 2013-12-01 ENCOUNTER — Encounter: Payer: Self-pay | Admitting: Sports Medicine

## 2013-12-01 ENCOUNTER — Ambulatory Visit (INDEPENDENT_AMBULATORY_CARE_PROVIDER_SITE_OTHER): Payer: BC Managed Care – PPO | Admitting: Sports Medicine

## 2013-12-01 VITALS — BP 127/76 | HR 86 | Wt 156.0 lb

## 2013-12-01 DIAGNOSIS — M25561 Pain in right knee: Secondary | ICD-10-CM

## 2013-12-01 DIAGNOSIS — M25569 Pain in unspecified knee: Secondary | ICD-10-CM

## 2013-12-01 DIAGNOSIS — M6740 Ganglion, unspecified site: Secondary | ICD-10-CM

## 2013-12-01 NOTE — Assessment & Plan Note (Addendum)
Feels to be attached to the flexor hallucis longus tendon sheath. We are going to put this off for now. He has a trip coming up, afterwards if still present we can consider an injection.

## 2013-12-01 NOTE — Assessment & Plan Note (Signed)
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 Injection as above. Continue exercises. Return in one month.

## 2013-12-01 NOTE — Progress Notes (Addendum)
  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 Moderate, persistent.  Ganglion cyst left foot: Feels to be attached to the flexor hallucis longus tendon, desires to defer treatment of this until he gets back from a trip.  Past medical history, Surgical history, Family history not pertinant except as noted below, Social history, Allergies, and medications have been entered into the medical record, reviewed, and no changes needed.   Review of Systems: No fevers, chills, night sweats, weight loss, chest pain, or shortness of breath.   Objective:    General: Well Developed, well nourished, and in no acute distress.  Neuro: Alert and oriented x3, extra-ocular muscles intact, sensation grossly intact.  HEENT: Normocephalic, atraumatic, pupils equal round reactive to light, neck supple, no masses, no lymphadenopathy, thyroid nonpalpable.  Skin: Warm and dry, no rashes. Cardiac: Regular rate and rhythm, no murmurs rubs or gallops, no lower extremity edema.  Respiratory: Clear to auscultation bilaterally. Not using accessory muscles, speaking in full sentences. Right Knee: Normal to inspection with no erythema or effusion or obvious bony abnormalities. Tender to palpation under the medial patellar facet  ROM full in flexion and extension and lower leg rotation. Ligaments with solid consistent endpoints including ACL, PCL, LCL, MCL. Negative Mcmurray's, Apley's, and Thessalonian tests. Non painful patellar compression. Patellar glide without crepitus. Patellar and quadriceps tendons unremarkable. Hamstring and quadriceps strength is normal.   Procedure: Real-time Ultrasound Guided Injection of right knee Device: GE Logiq E  Verbal informed consent obtained.  Time-out conducted.  Noted no overlying  erythema, induration, or other signs of local infection.  Skin prepped in a sterile fashion.  Local anesthesia: Topical Ethyl chloride.  With sterile technique and under real time ultrasound guidance: 2 cc Kenalog 40, 4 cc lidocaine injected easily into the suprapatellar recess.  Completed without difficulty  Pain immediately resolved suggesting accurate placement of the medication.  Advised to call if fevers/chills, erythema, induration, drainage, or persistent bleeding.  Images permanently stored and available for review in the ultrasound unit.  Impression: Technically successful ultrasound guided injection.  Impression and Recommendations:

## 2013-12-11 ENCOUNTER — Encounter (INDEPENDENT_AMBULATORY_CARE_PROVIDER_SITE_OTHER): Payer: BC Managed Care – PPO | Admitting: Physical Therapy

## 2013-12-11 DIAGNOSIS — M25569 Pain in unspecified knee: Secondary | ICD-10-CM

## 2013-12-29 ENCOUNTER — Ambulatory Visit (INDEPENDENT_AMBULATORY_CARE_PROVIDER_SITE_OTHER): Payer: BC Managed Care – PPO

## 2013-12-29 ENCOUNTER — Ambulatory Visit (INDEPENDENT_AMBULATORY_CARE_PROVIDER_SITE_OTHER): Payer: BC Managed Care – PPO | Admitting: Sports Medicine

## 2013-12-29 ENCOUNTER — Encounter: Payer: Self-pay | Admitting: Sports Medicine

## 2013-12-29 VITALS — BP 114/71 | HR 67 | Wt 154.0 lb

## 2013-12-29 DIAGNOSIS — M25569 Pain in unspecified knee: Secondary | ICD-10-CM

## 2013-12-29 DIAGNOSIS — M6740 Ganglion, unspecified site: Secondary | ICD-10-CM

## 2013-12-29 DIAGNOSIS — M25561 Pain in right knee: Secondary | ICD-10-CM

## 2013-12-29 NOTE — Progress Notes (Signed)
  Subjective:    CC: Follow up  HPI: Right knee pain: Chondromalacia of the patella. Resolved after injection.  Insertional patellar tendinitis: Still having some pain despite nitroglycerin and physical therapy. Pain has localized at the inferior aspect of the patellar tendon.  Ganglion cyst: Localized over the plantar aspect of the left foot near the flexor hallucis longus tendon at the metacarpophalangeal joint. Mildly tender to palpation.  Past medical history, Surgical history, Family history not pertinant except as noted below, Social history, Allergies, and medications have been entered into the medical record, reviewed, and no changes needed.   Review of Systems: No fevers, chills, night sweats, weight loss, chest pain, or shortness of breath.   Objective:    General: Well Developed, well nourished, and in no acute distress.  Neuro: Alert and oriented x3, extra-ocular muscles intact, sensation grossly intact.  HEENT: Normocephalic, atraumatic, pupils equal round reactive to light, neck supple, no masses, no lymphadenopathy, thyroid nonpalpable.  Skin: Warm and dry, no rashes. Cardiac: Regular rate and rhythm, no murmurs rubs or gallops, no lower extremity edema.  Respiratory: Clear to auscultation bilaterally. Not using accessory muscles, speaking in full sentences. Right knee: Tender to palpation at the inferior insertion of the patellar tendon. Left foot: There is a palpable 1 cm nodule that is well defined and movable at the plantar aspect of the first metatarsophalangeal joint.  Procedure: Real-time Ultrasound Guided Injection of left foot ganglion cyst Device: GE Logiq E  Verbal informed consent obtained.  Time-out conducted.  Noted no overlying erythema, induration, or other signs of local infection.  Skin prepped in a sterile fashion.  Local anesthesia: Topical Ethyl chloride.  With sterile technique and under real time ultrasound guidance:  There did appear to be  hypoechoic structure inside the cyst, a total of 1 cc Kenalog 40, 1 cc lidocaine injected into the cyst which was then fenestrated multiple times. Completed without difficulty  Pain immediately resolved suggesting accurate placement of the medication.  Advised to call if fevers/chills, erythema, induration, drainage, or persistent bleeding.  Images permanently stored and available for review in the ultrasound unit.  Impression: Technically successful ultrasound guided injection.  Impression and Recommendations:

## 2013-12-29 NOTE — Assessment & Plan Note (Addendum)
Feels to be in close approximation to the flexor hallucis longus tendon. Injection as above. There was a hypoechoic structure inside the ganglion cyst, I am going to get some x-rays. Return to see me in 6 weeks.

## 2013-12-29 NOTE — Assessment & Plan Note (Signed)
Patellofemoral-type pain has completely resolved after injection. He still does have some pain he localizes at the distal insertion of the patellar tendon. He likely does have a deep infrapatellar bursitis, if this is still present in 1-2 months we can consider a deep infrapatellar bursa injection. He should continue his nitroglycerin patch.

## 2014-02-09 ENCOUNTER — Ambulatory Visit (INDEPENDENT_AMBULATORY_CARE_PROVIDER_SITE_OTHER): Payer: BC Managed Care – PPO | Admitting: Sports Medicine

## 2014-02-09 ENCOUNTER — Encounter: Payer: Self-pay | Admitting: Sports Medicine

## 2014-02-09 VITALS — BP 117/76 | HR 65 | Ht 66.0 in | Wt 152.0 lb

## 2014-02-09 DIAGNOSIS — M6740 Ganglion, unspecified site: Secondary | ICD-10-CM

## 2014-02-09 DIAGNOSIS — M25561 Pain in right knee: Secondary | ICD-10-CM

## 2014-02-09 DIAGNOSIS — M25569 Pain in unspecified knee: Secondary | ICD-10-CM

## 2014-02-09 NOTE — Progress Notes (Signed)
  Subjective:    CC: Followup  HPI: Right patellofemoral syndrome: Pain under the kneecap has resolved after injection, he continues to have some pain only when playing soccer he localizes deep to the tibial insertion of the patellar tendon. At this point he feels pretty good and has no limitations in activity so does not desire to pursue any further interventional treatment.  Foot pain: Previously injected and fenestrated at the last visit, overall mass has shrunk significantly, it still hurts but minimal.  Past medical history, Surgical history, Family history not pertinant except as noted below, Social history, Allergies, and medications have been entered into the medical record, reviewed, and no changes needed.   Review of Systems: No fevers, chills, night sweats, weight loss, chest pain, or shortness of breath.   Objective:    General: Well Developed, well nourished, and in no acute distress.  Neuro: Alert and oriented x3, extra-ocular muscles intact, sensation grossly intact.  HEENT: Normocephalic, atraumatic, pupils equal round reactive to light, neck supple, no masses, no lymphadenopathy, thyroid nonpalpable.  Skin: Warm and dry, no rashes. Cardiac: Regular rate and rhythm, no murmurs rubs or gallops, no lower extremity edema.  Respiratory: Clear to auscultation bilaterally. Not using accessory muscles, speaking in full sentences. Right knee: Still with discrete tenderness to palpation deep to the distal patellar tendon Left foot: There is a palpable movable mass approximately 1 cm across it feels to be just deep to the skin.  Impression and Recommendations:

## 2014-02-09 NOTE — Assessment & Plan Note (Signed)
Continues to do well several months after the injection. Unfortunately he has not been very compliant with his rehabilitation exercises. Pain under the kneecap has resolved, he continues to have some pain in the deep infrapatellar bursa deep to the tibial insertion of the patellar tendon. Certainly what he desires I could place an injection into the deep infrapatellar bursa.

## 2014-02-09 NOTE — Assessment & Plan Note (Signed)
Cyst now feels more superficial however it is significantly smaller than when I saw him last time. I think that this is superficial enough for me to remove it in the office. He can come back to see me on an as-needed basis, I would meet him out of soccer for at least 2 weeks if I removed this lesion.

## 2014-05-04 ENCOUNTER — Encounter: Payer: Self-pay | Admitting: Family Medicine

## 2014-05-04 ENCOUNTER — Ambulatory Visit (INDEPENDENT_AMBULATORY_CARE_PROVIDER_SITE_OTHER): Payer: BC Managed Care – PPO | Admitting: Family Medicine

## 2014-05-04 VITALS — BP 125/67 | HR 77 | Ht 66.0 in | Wt 158.0 lb

## 2014-05-04 DIAGNOSIS — E348 Other specified endocrine disorders: Secondary | ICD-10-CM

## 2014-05-04 DIAGNOSIS — R030 Elevated blood-pressure reading, without diagnosis of hypertension: Secondary | ICD-10-CM

## 2014-05-04 LAB — POCT GLYCOSYLATED HEMOGLOBIN (HGB A1C): Hemoglobin A1C: 6

## 2014-05-04 NOTE — Progress Notes (Signed)
   Subjective:    Patient ID: George Young, male    DOB: 1966/06/08, 48 y.o.   MRN: [id number]  HPI IFG - not checking sugar. He feels like he is doing better with diet and has started exercise program and playing soccer on Sundays.    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  Tolerated it well.     Review of Systems     Objective:   Physical Exam  Constitutional: He is oriented to person, place, and time. He appears well-developed and well-nourished.  HENT:  Head: Normocephalic and atraumatic.  Cardiovascular: Normal rate, regular rhythm and normal heart sounds.   Pulmonary/Chest: Effort normal and breath sounds normal.  Neurological: He is alert and oriented to person, place, and time.  Skin: Skin is warm and dry.  Psychiatric: He has a normal mood and affect. His behavior is normal.          Assessment & Plan:  IFG - STable.  I think now that working on diet and exercise should look even better in 6 months. F/U in 6 months.

## 2014-05-10 ENCOUNTER — Ambulatory Visit: Payer: BC Managed Care – PPO | Admitting: Sports Medicine

## 2014-09-04 ENCOUNTER — Telehealth: Payer: Self-pay | Admitting: Family Medicine

## 2014-09-04 DIAGNOSIS — E7800 Pure hypercholesterolemia, unspecified: Secondary | ICD-10-CM

## 2014-09-04 DIAGNOSIS — E348 Other specified endocrine disorders: Secondary | ICD-10-CM

## 2014-09-04 NOTE — Telephone Encounter (Signed)
Order placed and faxed.Stephane Niemann [place]  

## 2014-09-04 NOTE — Telephone Encounter (Signed)
George Young called.  He has an appt scheduled on 9/23 at 8:45 and would like Order sent down to lab to have AIC and cholesterol drawn.  Thank you

## 2014-09-19 ENCOUNTER — Encounter: Payer: Self-pay | Admitting: Family Medicine

## 2014-09-19 ENCOUNTER — Ambulatory Visit (INDEPENDENT_AMBULATORY_CARE_PROVIDER_SITE_OTHER): Payer: BC Managed Care – PPO | Admitting: Family Medicine

## 2014-09-19 VITALS — BP 121/79 | HR 72 | Temp 98.0°F | Ht 66.0 in | Wt 153.0 lb

## 2014-09-19 DIAGNOSIS — E348 Other specified endocrine disorders: Secondary | ICD-10-CM

## 2014-09-19 DIAGNOSIS — E785 Hyperlipidemia, unspecified: Secondary | ICD-10-CM | POA: Diagnosis not present

## 2014-09-19 LAB — LIPID PANEL
Cholesterol: 177 mg/dL (ref 0–200)
HDL: 57 mg/dL (ref >39)
LDL Cholesterol: 94 mg/dL (ref 0–99)
TRIGLYCERIDES: 128 mg/dL (ref <150)
Total CHOL/HDL Ratio: 3.1 Ratio
VLDL: 26 mg/dL (ref 0–40)

## 2014-09-19 LAB — GLUCOSE, POCT (MANUAL RESULT ENTRY): POC Glucose: 117 mg/dl — AB (ref 70–99)

## 2014-09-19 LAB — POCT GLYCOSYLATED HEMOGLOBIN (HGB A1C): HEMOGLOBIN A1C: 5.8

## 2014-09-19 NOTE — Progress Notes (Signed)
   Subjective:    Patient ID: George Young, male    DOB: August 27, 1966, 48 y.o.   MRN: [id number]  Diabetes   4 mo f/u for IFG - Has lost 5 lbs since last here. Has been exercising.  Needs glucose check for work.  Has been under som stess. He is alo doing a degree on line. There have been cut backs at work.  Occ feel hot at night and sweaty. Hard time relaxing.  Hasn't been sleeping as well.   Hyperlipidemia - Due for repeat lipoids.  Went to lab this AM.  No CP or SOB.     Review of Systems     Objective:   Physical Exam  Constitutional: He is oriented to person, place, and time. He appears well-developed and well-nourished.  HENT:  Head: Normocephalic and atraumatic.  Cardiovascular: Normal rate, regular rhythm and normal heart sounds.   Pulmonary/Chest: Effort normal and breath sounds normal.  Neurological: He is alert and oriented to person, place, and time.  Skin: Skin is warm and dry.  Psychiatric: He has a normal mood and affect. His behavior is normal.          Assessment & Plan:  IFG - well controlled at 5.7. Great job.  F/u in 6 months  Biometric screen performed for work.   Hyperlipidemia - Will look at labs and adjust medication if needed.   Declined flu vaccine.

## 2014-09-20 LAB — HEMOGLOBIN A1C
HEMOGLOBIN A1C: 6.2 % — AB (ref <5.7)
Mean Plasma Glucose: 131 mg/dL — ABNORMAL HIGH (ref <117)

## 2014-11-05 ENCOUNTER — Ambulatory Visit: Payer: BC Managed Care – PPO | Admitting: Family Medicine

## 2014-11-12 ENCOUNTER — Encounter: Payer: Self-pay | Admitting: Family Medicine

## 2014-11-12 ENCOUNTER — Ambulatory Visit (INDEPENDENT_AMBULATORY_CARE_PROVIDER_SITE_OTHER): Payer: BC Managed Care – PPO | Admitting: Family Medicine

## 2014-11-12 VITALS — BP 131/85 | HR 82 | Temp 98.0°F | Wt 155.0 lb

## 2014-11-12 DIAGNOSIS — J019 Acute sinusitis, unspecified: Secondary | ICD-10-CM

## 2014-11-12 MED ORDER — ATORVASTATIN CALCIUM 10 MG PO TABS: ORAL_TABLET | ORAL | Status: DC

## 2014-11-12 MED ORDER — AMOXICILLIN-POT CLAVULANATE 875-125 MG PO TABS: 1.0000 | ORAL_TABLET | Freq: Two times a day (BID) | ORAL | Status: DC

## 2014-11-12 NOTE — Patient Instructions (Signed)

## 2014-11-12 NOTE — Progress Notes (Signed)
   Subjective:    Patient ID: George Young, male    DOB: January 16, 1966, 48 y.o.   MRN: [id number]  HPI 1 week of significant nasal congestion bilateral temple pain. He's had headaches with it.  Initiallythought it was related to change in weather but it is going away. He is using an over-the-counter Mucinex type product.he denies any fevers chills or sweats. He denies any cough. He has had a very sore throat and painful to swallow. No ear pain. No GI symptoms.  Review of Systems     Objective:   Physical Exam  Constitutional: He is oriented to person, place, and time. He appears well-developed and well-nourished.  HENT:  Head: Normocephalic and atraumatic.  Right Ear: External ear normal.  Left Ear: External ear normal.  Nose: Nose normal.  Mouth/Throat: Oropharynx is clear and moist.  TMs and canals are clear. Turbinates are swollen.  Eyes: Conjunctivae and EOM are normal. Pupils are equal, round, and reactive to light.  Neck: Neck supple. No thyromegaly present.  Cardiovascular: Normal rate and normal heart sounds.   Pulmonary/Chest: Effort normal and breath sounds normal.  Lymphadenopathy:    He has no cervical adenopathy.  Neurological: He is alert and oriented to person, place, and time.  Skin: Skin is warm and dry.  Psychiatric: He has a normal mood and affect.          Assessment & Plan:  Acute sinusitis-viral versus bacterial. He says however week at this point. We'll go ahead and treat with Augmentin. If she's not significantly better in one week to please give us a call. He could still ought to wait a few days to see if he starts to feel better. Is completely up to him. Okay to keep taking the Mucinex.

## 2015-05-09 ENCOUNTER — Encounter: Payer: Self-pay | Admitting: Family Medicine

## 2015-05-09 ENCOUNTER — Ambulatory Visit (INDEPENDENT_AMBULATORY_CARE_PROVIDER_SITE_OTHER): Payer: BLUE CROSS/BLUE SHIELD | Admitting: Family Medicine

## 2015-05-09 VITALS — BP 137/79 | HR 80 | Ht 66.0 in | Wt 161.0 lb

## 2015-05-09 DIAGNOSIS — M654 Radial styloid tenosynovitis [de Quervain]: Secondary | ICD-10-CM

## 2015-05-09 NOTE — Progress Notes (Signed)
   Subjective:    Patient ID: George Young, male    DOB: 1966/04/18, 49 y.o.   MRN: [id number]  HPI Left thumb pain x 1.5-2 mo. No truama or injury. No medication.  No swelling of the joints.  He is right handed.  More painful with grip and squeezing  No ole imjuries.    Review of Systems     Objective:   Physical Exam  Constitutional: He is oriented to person, place, and time. He appears well-developed and well-nourished.  HENT:  Head: Normocephalic and atraumatic.  Musculoskeletal:  Wrist with normal range of motion and nontender. Thumb with normal range of motion. Mildly tender just proximal to the DIP joint. Some tenderness proximal to the PIP joint as well. Strength in the thumb is 5 out of 5 in all directions. Positive Finkelstein's test.  Neurological: He is alert and oriented to person, place, and time.  Skin: Skin is warm and dry.  Psychiatric: He has a normal mood and affect. His behavior is normal.          Assessment & Plan:  De Quervain's tenosynovitis, left hand-discussed diagnosis. Recommend icing 3 times a day. Ibuprofen 60 mg 3 times a day. Take with food and water. Stop immediately if any GI upset or irritation. Encouraged him to take the anti-inflammatory for one week if able to tolerate it. In about 3-4 days go ahead and start the stretches that I have given him on a handout. If not at least 30% improved over the next 3-4 weeks and give us a call and we will place him with our sports medicine doctor for possible injection and to confirm diagnosis.

## 2015-06-01 ENCOUNTER — Emergency Department (INDEPENDENT_AMBULATORY_CARE_PROVIDER_SITE_OTHER)
Admission: EM | Admit: 2015-06-01 | Discharge: 2015-06-01 | Disposition: A | Discharge status: Home / Self Care | Payer: BLUE CROSS/BLUE SHIELD | Source: Home / Self Care | Attending: Emergency Medicine | Admitting: Emergency Medicine

## 2015-06-01 ENCOUNTER — Encounter: Payer: Self-pay | Admitting: *Deleted

## 2015-06-01 DIAGNOSIS — J0121 Acute recurrent ethmoidal sinusitis: Secondary | ICD-10-CM

## 2015-06-01 MED ORDER — AMOXICILLIN-POT CLAVULANATE 875-125 MG PO TABS: 1.0000 | ORAL_TABLET | Freq: Two times a day (BID) | ORAL | Status: DC

## 2015-06-01 NOTE — Discharge Instructions (Signed)

## 2015-06-01 NOTE — ED Provider Notes (Signed)
CSN: [id number]     Arrival date & time 06/01/15  4562 History   First MD Initiated Contact with Patient 06/01/15 [phone]     Chief Complaint  Patient presents with  . Headache  . Sore Throat   (Consider location/radiation/quality/duration/timing/severity/associated sxs/prior Treatment) Patient is a 49 y.o. male presenting with headaches and pharyngitis. The history is provided by the patient. No language interpreter was used.  Headache Pain location:  Frontal Radiates to:  Does not radiate Severity currently:  2/10 Severity at highest:  2/10 Onset quality:  Gradual Timing:  Constant Progression:  Worsening Chronicity:  New Similar to prior headaches: no   Context: activity   Relieved by:  Nothing Worsened by:  Nothing Ineffective treatments:  None tried Associated symptoms: sore throat   Sore Throat Associated symptoms include headaches.    Past Medical History  Diagnosis Date  . Hiatal hernia   . Wrist fracture 2010  . Hyperlipemia    Past Surgical History  Procedure Laterality Date  . Esophagogastroduodenoscopy    . Lasik  08/2013    Right   Family History  Problem Relation Age of Onset  . Hyperlipidemia Mother   . Heart disease Mother     Has pacemaker  . Irregular heart beat Mother     pacemaker  . Hypertension Father    History  Substance Use Topics  . Smoking status: Never Smoker   . Smokeless tobacco: Not on file  . Alcohol Use: Yes     Comment: 2 per mth    Review of Systems  HENT: Positive for sore throat.   Neurological: Positive for headaches.  All other systems reviewed and are negative.   Allergies  Simvastatin  Home Medications   Prior to Admission medications   Medication Sig Start Date End Date Taking? Authorizing Provider  ACCU-CHEK FASTCLIX LANCETS [place]      Historical Provider, MD  atorvastatin (LIPITOR) 10 MG tablet TAKE 1 TABLET DAILY 11/12/14   Hali Marry, MD  Blood Glucose Monitoring Suppl (ACCU-CHEK ADVANTAGE  DIABETES) kit Use as instructed     Historical Provider, MD  cetirizine (ZYRTEC) 10 MG tablet Take 1 tablet (10 mg total) by mouth daily. 11/03/13   Hali Marry, MD  finasteride (PROPECIA) 1 MG tablet Take 5 mg by mouth daily. 1/2 tab daily    Historical Provider, MD  fluticasone (FLONASE) 50 MCG/ACT nasal spray Place 2 sprays into both nostrils daily. 11/03/13   Hali Marry, MD   BP 130/84 mmHg  Pulse 72  Temp(Src) 98.3 F (36.8 C) (Oral)  Ht _0  (1.676 m)  Wt 158 lb (71.668 kg)  BMI 25.51 kg/m2  SpO2 96% Physical Exam  Constitutional: He is oriented to person, place, and time. He appears well-developed and well-nourished.  HENT:  Head: Normocephalic.  Right Ear: External ear normal.  Left Ear: External ear normal.  Erythema throat.  Eyes: EOM are normal.  Neck: Normal range of motion.  Cardiovascular: Normal rate and normal heart sounds.   Pulmonary/Chest: Effort normal.  Abdominal: He exhibits no distension.  Musculoskeletal: Normal range of motion.  Neurological: He is alert and oriented to person, place, and time.  Psychiatric: He has a normal mood and affect.  Nursing note and vitals reviewed.   ED Course  Procedures (including critical care time) Labs Review Labs Reviewed - No data to display  Imaging Review No results found.   MDM   1. Acute recurrent ethmoidal sinusitis    AVS  augmentin Return if any problems.    [place], PA-C 06/01/15 1001

## 2015-06-01 NOTE — ED Notes (Signed)
Pt complains of headache, sore throat, and congestion for 5 days.  Pain 7/10. Denies fevers.

## 2015-07-02 ENCOUNTER — Other Ambulatory Visit: Payer: Self-pay | Admitting: *Deleted

## 2015-07-02 MED ORDER — FLUTICASONE PROPIONATE 50 MCG/ACT NA SUSP: 2.0000 | Freq: Every day | NASAL | Status: DC

## 2015-07-15 ENCOUNTER — Other Ambulatory Visit: Payer: Self-pay | Admitting: Family Medicine

## 2015-07-29 ENCOUNTER — Other Ambulatory Visit: Payer: Self-pay | Admitting: Family Medicine

## 2015-09-12 ENCOUNTER — Telehealth: Payer: Self-pay | Admitting: Family Medicine

## 2015-09-12 ENCOUNTER — Ambulatory Visit (INDEPENDENT_AMBULATORY_CARE_PROVIDER_SITE_OTHER): Payer: BLUE CROSS/BLUE SHIELD | Admitting: Family Medicine

## 2015-09-12 ENCOUNTER — Encounter: Payer: Self-pay | Admitting: Family Medicine

## 2015-09-12 VITALS — BP 122/73 | HR 63 | Ht 66.0 in | Wt 156.0 lb

## 2015-09-12 DIAGNOSIS — R0981 Nasal congestion: Secondary | ICD-10-CM | POA: Diagnosis not present

## 2015-09-12 DIAGNOSIS — R7301 Impaired fasting glucose: Secondary | ICD-10-CM

## 2015-09-12 DIAGNOSIS — E7800 Pure hypercholesterolemia, unspecified: Secondary | ICD-10-CM

## 2015-09-12 DIAGNOSIS — G4700 Insomnia, unspecified: Secondary | ICD-10-CM | POA: Diagnosis not present

## 2015-09-12 DIAGNOSIS — R0683 Snoring: Secondary | ICD-10-CM | POA: Diagnosis not present

## 2015-09-12 DIAGNOSIS — K2970 Gastritis, unspecified, without bleeding: Secondary | ICD-10-CM | POA: Diagnosis not present

## 2015-09-12 DIAGNOSIS — G4719 Other hypersomnia: Secondary | ICD-10-CM

## 2015-09-12 DIAGNOSIS — Z0000 Encounter for general adult medical examination without abnormal findings: Secondary | ICD-10-CM

## 2015-09-12 DIAGNOSIS — R972 Elevated prostate specific antigen [PSA]: Secondary | ICD-10-CM | POA: Diagnosis not present

## 2015-09-12 DIAGNOSIS — R030 Elevated blood-pressure reading, without diagnosis of hypertension: Secondary | ICD-10-CM | POA: Diagnosis not present

## 2015-09-12 DIAGNOSIS — Z114 Encounter for screening for human immunodeficiency virus [HIV]: Secondary | ICD-10-CM | POA: Diagnosis not present

## 2015-09-12 LAB — COMPLETE METABOLIC PANEL WITH GFR
ALK PHOS: 68 U/L (ref 40–115)
ALT: 18 U/L (ref 9–46)
AST: 16 U/L (ref 10–40)
Albumin: 4.6 g/dL (ref 3.6–5.1)
BUN: 12 mg/dL (ref 7–25)
CO2: 29 mmol/L (ref 20–31)
Calcium: 9.9 mg/dL (ref 8.6–10.3)
Chloride: 101 mmol/L (ref 98–110)
Creat: 0.75 mg/dL (ref 0.60–1.35)
GFR, Est African American: >89 mL/min (ref >=60)
Glucose, Bld: 103 mg/dL — ABNORMAL HIGH (ref 65–99)
Potassium: 4.4 mmol/L (ref 3.5–5.3)
Sodium: 140 mmol/L (ref 135–146)
Total Bilirubin: 0.6 mg/dL (ref 0.2–1.2)
Total Protein: 6.9 g/dL (ref 6.1–8.1)

## 2015-09-12 LAB — LIPID PANEL
Cholesterol: 184 mg/dL (ref 125–200)
HDL: 48 mg/dL (ref >=40)
LDL Cholesterol: 105 mg/dL (ref <130)
Total CHOL/HDL Ratio: 3.8 Ratio (ref <=5.0)
Triglycerides: 156 mg/dL — ABNORMAL HIGH (ref <150)
VLDL: 31 mg/dL — ABNORMAL HIGH (ref <30)

## 2015-09-12 LAB — POCT GLYCOSYLATED HEMOGLOBIN (HGB A1C): HEMOGLOBIN A1C: 6.2

## 2015-09-12 MED ORDER — CETIRIZINE HCL 10 MG PO TABS: 10.0000 mg | ORAL_TABLET | Freq: Every day | ORAL | Status: DC

## 2015-09-12 MED ORDER — RANITIDINE HCL 150 MG PO CAPS: 150.0000 mg | ORAL_CAPSULE | Freq: Two times a day (BID) | ORAL | Status: DC

## 2015-09-12 MED ORDER — FLUTICASONE PROPIONATE 50 MCG/ACT NA SUSP: 2.0000 | Freq: Every day | NASAL | Status: DC

## 2015-09-12 MED ORDER — ATORVASTATIN CALCIUM 10 MG PO TABS: ORAL_TABLET | ORAL | Status: DC

## 2015-09-12 NOTE — Telephone Encounter (Signed)
Please call patient and let him know that he did screen positive for possible sleep apnea. I would actually like to set him up for a sleep study in the home. If he is okay with that then will move forward with order.

## 2015-09-12 NOTE — Telephone Encounter (Signed)
lvm informing pt of recommendations and advised him to rtn call if this is ok.George Young [place]

## 2015-09-12 NOTE — Progress Notes (Signed)
Subjective:    Patient ID: George Young, male    DOB: Mar 23, 1966, 49 y.o.   MRN: [id number]  HPI Here for CPE   He does have several concerns below including nasal congestion, snoring, epigastric pain/gastritis, and continued thumb and wrist pain. Please see under assessment and plan for additional information and treatment plan.   Review of Systems  BP 122/73 mmHg  Pulse 63  Ht _0  (1.676 m)  Wt 156 lb (70.761 kg)  BMI 25.19 kg/m2    Allergies  Allergen Reactions  . Simvastatin     REACTION: Myalgias    Past Medical History  Diagnosis Date  . Hiatal hernia   . Wrist fracture 2010  . Hyperlipemia     Past Surgical History  Procedure Laterality Date  . Esophagogastroduodenoscopy    . Lasik  08/2013    Right    Social History   Social History  . Marital Status: Married    Spouse Name: N/A  . Number of Children: N/A  . Years of Education: N/A   Occupational History  .      [employer]   Social History Main Topics  . Smoking status: Never Smoker   . Smokeless tobacco: Not on file  . Alcohol Use: Yes     Comment: 2 per mth  . Drug Use: No  . Sexual Activity: Not on file   Other Topics Concern  . Not on file   Social History Narrative   Some exercise.      Family History  Problem Relation Age of Onset  . Hyperlipidemia Mother   . Heart disease Mother     Has pacemaker  . Irregular heart beat Mother     pacemaker  . Hypertension Father     Outpatient Encounter Prescriptions as of 09/12/2015  Medication Sig  . ACCU-CHEK FASTCLIX LANCETS MISC    . atorvastatin (LIPITOR) 10 MG tablet TAKE 1 TABLET DAILY  . Blood Glucose Monitoring Suppl (ACCU-CHEK ADVANTAGE DIABETES) kit Use as instructed   . cetirizine (ZYRTEC) 10 MG tablet Take 1 tablet (10 mg total) by mouth daily.  . finasteride (PROPECIA) 1 MG tablet Take 5 mg by mouth daily. 1/2 tab daily  . fluticasone (FLONASE) 50 MCG/ACT nasal spray Place 2 sprays into both nostrils daily.  .  [DISCONTINUED] fluticasone (FLONASE) 50 MCG/ACT nasal spray Place 2 sprays into both nostrils daily.  . [DISCONTINUED] amoxicillin-clavulanate (AUGMENTIN) 875-125 MG per tablet Take 1 tablet by mouth every 12 (twelve) hours.   No facility-administered encounter medications on file as of 09/12/2015.           Objective:   Physical Exam  Constitutional: He is oriented to person, place, and time. He appears well-developed and well-nourished.  HENT:  Head: Normocephalic and atraumatic.  Right Ear: External ear normal.  Left Ear: External ear normal.  Nose: Nose normal.  Mouth/Throat: Oropharynx is clear and moist.  Eyes: Conjunctivae and EOM are normal. Pupils are equal, round, and reactive to light.  Neck: Normal range of motion. Neck supple. No thyromegaly present.  Cardiovascular: Normal rate, regular rhythm, normal heart sounds and intact distal pulses.   Pulmonary/Chest: Effort normal and breath sounds normal.  Abdominal: Soft. Bowel sounds are normal. He exhibits no distension and no mass. There is no tenderness. There is no rebound and no guarding.  Musculoskeletal: Normal range of motion.  Lymphadenopathy:    He has no cervical adenopathy.  Neurological: He is alert and oriented to person, place,  and time. He has normal reflexes.  Skin: Skin is warm and dry.  Psychiatric: He has a normal mood and affect. His behavior is normal. Judgment and thought content normal.          Assessment & Plan:  CPE Keep up a regular exercise program and make sure you are eating a healthy diet Try to eat 4 servings of dairy a day, or if you are lactose intolerant take a calcium with vitamin D daily.  Your vaccines are up to date.  Declined flu vaccine.     Gastritis - he still having occasional problems with reflux and epigastric pain. He's tried a couple of PPIs in the past without significant success. Recommend at least a trial of an H2 blocker such as ranitidine. He says most the time  if he really watches his diet it doesn't bother him but anytime he eats something spicy he gets discomfort or pain. He really doesn't take a lot of NSAIDs or other products.  Insomnia-we discussed sleep hygiene reviewed things that he can on his own to improve sleep quality. He's are using some melatonin and that's perfectly safe to continue and should be helpful. He has been staying up late several nights a week to finish his online schoolwork as he is trying to get a business degree and I suspect that this change in his sleep schedule is probably what has caused a disruption.  Nasal congestion-he is already using a nasal steroid but has run out. He can certainly restart that. We could always consider referral to ENT for further evaluation. I don't see anything concerning for sinus infection. He could consider adding Benadryl at bedtime.  Snoring-stop bang questionnaire given. He has been more congested over the last few to several months and feels like he's been snoring a lot more over the last year. Stop bang score of 4 which is considered a positive screening test. He complains of snoring, fatigue and feeling sleepy during the daytime, observed apneas and he is of male gender. Would recommend a home sleep study since he has no other comorbidities.  He is also still having some pain over the left thumb and wrist. 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 He has been trying to ice it. He says he still struggling with discomfort and pain and now feels like he's getting some symptoms in his right hand. 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 3 really has not been able to rest the area. I discussed with him that we could certainly move forward with getting him in with a sports medicine provider for possible injections  and pain relief. But ultimately he is going to have to reduce the repetitive use to get some long-term relief. He says he will think about it and let me know. He just bought some new ice packs or specifically designed for the wrists and wants to try using those first.  He has a form for [place] to complete for work and can fax it to our office.

## 2015-09-13 LAB — VITAMIN D 25 HYDROXY (VIT D DEFICIENCY, FRACTURES): VIT D 25 HYDROXY: 17 ng/mL — AB (ref 30–100)

## 2015-09-13 LAB — VITAMIN B12: VITAMIN B 12: 444 pg/mL (ref 211–911)

## 2015-09-13 LAB — HIV ANTIBODY (ROUTINE TESTING W REFLEX): HIV: NONREACTIVE

## 2015-09-13 LAB — PSA: PSA: 1.99 ng/mL (ref <=4.00)

## 2015-09-18 NOTE — Telephone Encounter (Signed)
Pt wanted to know the cost of the Home sleep study. He spoke w/someone at [facility] about this and stated that he will check with his insurance company with regards to the cost of this and will call their office back on whether or not he is going to proceed at this time or wait.Loralee Pacas [place]

## 2015-09-19 NOTE — Telephone Encounter (Signed)
Pt called and stated that his insurance will not cover the @ home sleep study. He said that this will need a PA and deemed medically necessary for him to have. I informed him that it is medically necessary because of the positive screen that was done here in our office. He stated that he will either have to go to [facility] or [facility] to have the testing done because [facility] is out of network for him. I told him that I woul

## 2015-09-21 IMAGING — CR DG FOOT COMPLETE 3+V*L*
3 series · 3 of 3 positions shown · non-contrast
Comparison: None.

CLINICAL DATA: Possible foreign body

EXAM:
LEFT FOOT - COMPLETE 3+ VIEW

[view not recorded (1 of 3)]
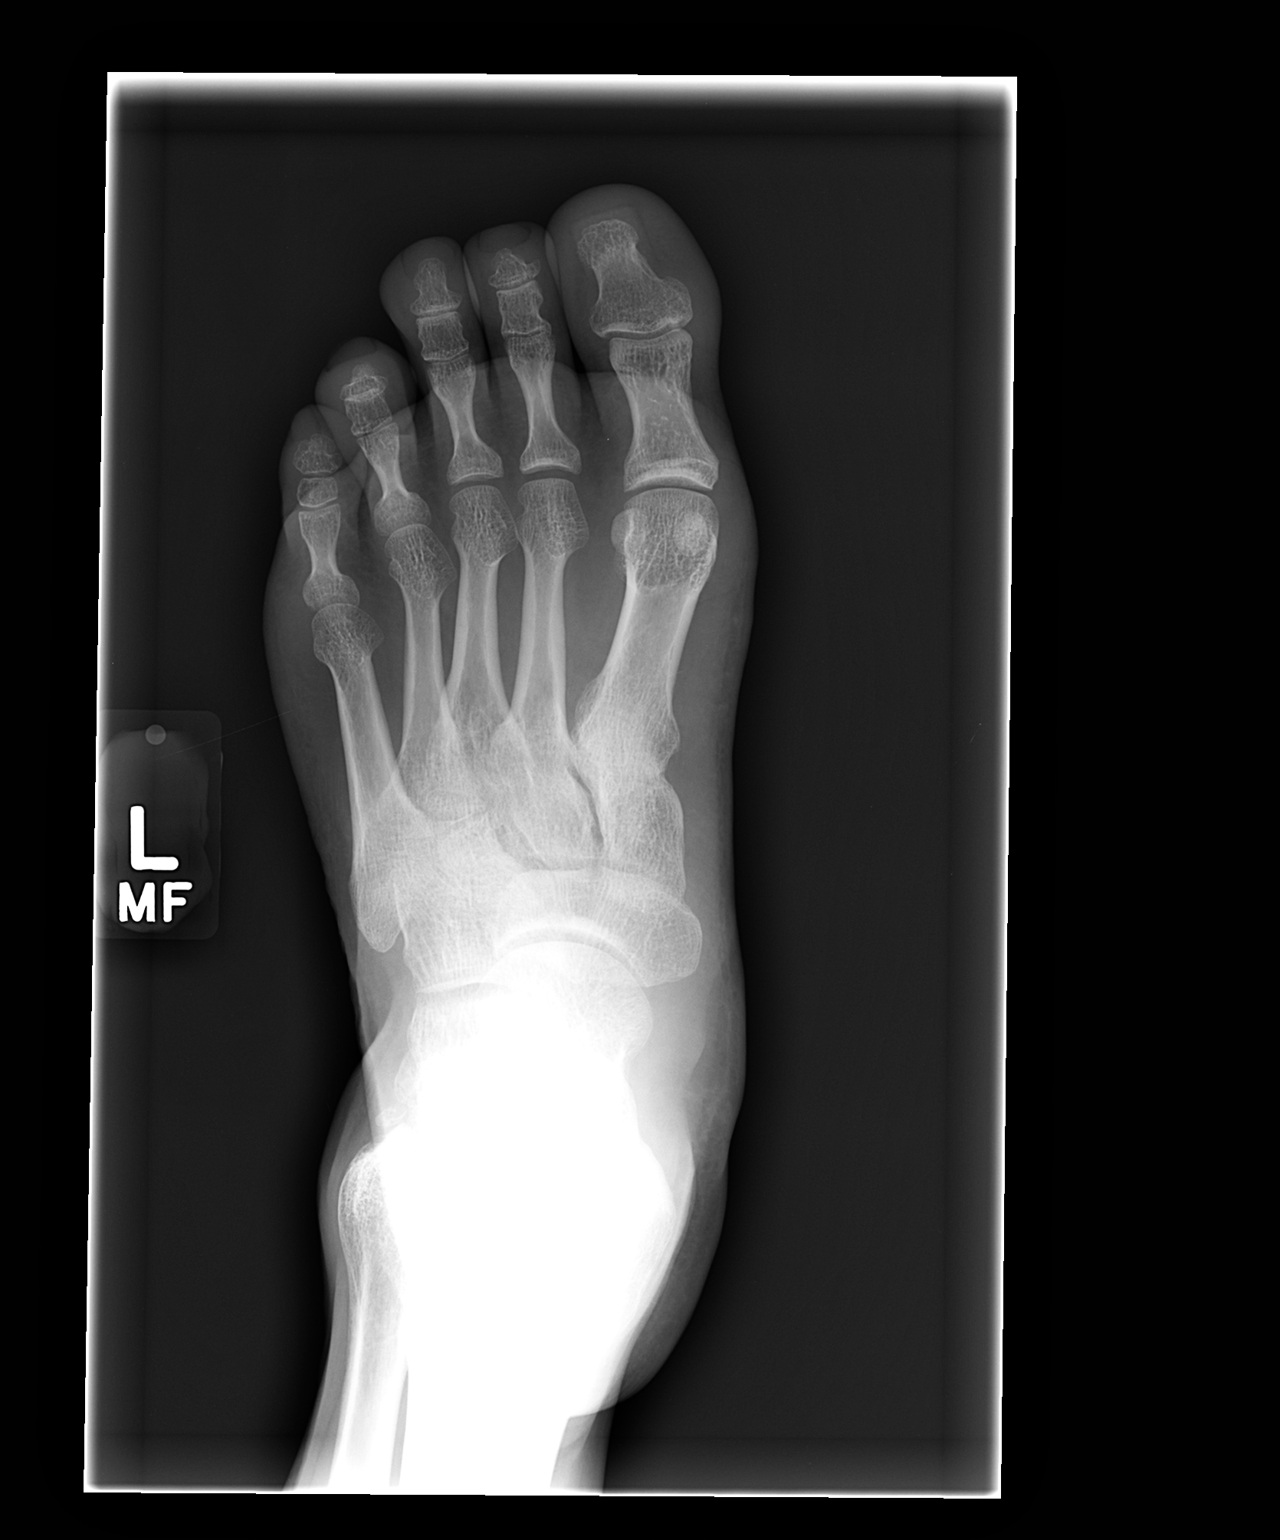

[view not recorded (2 of 3)]
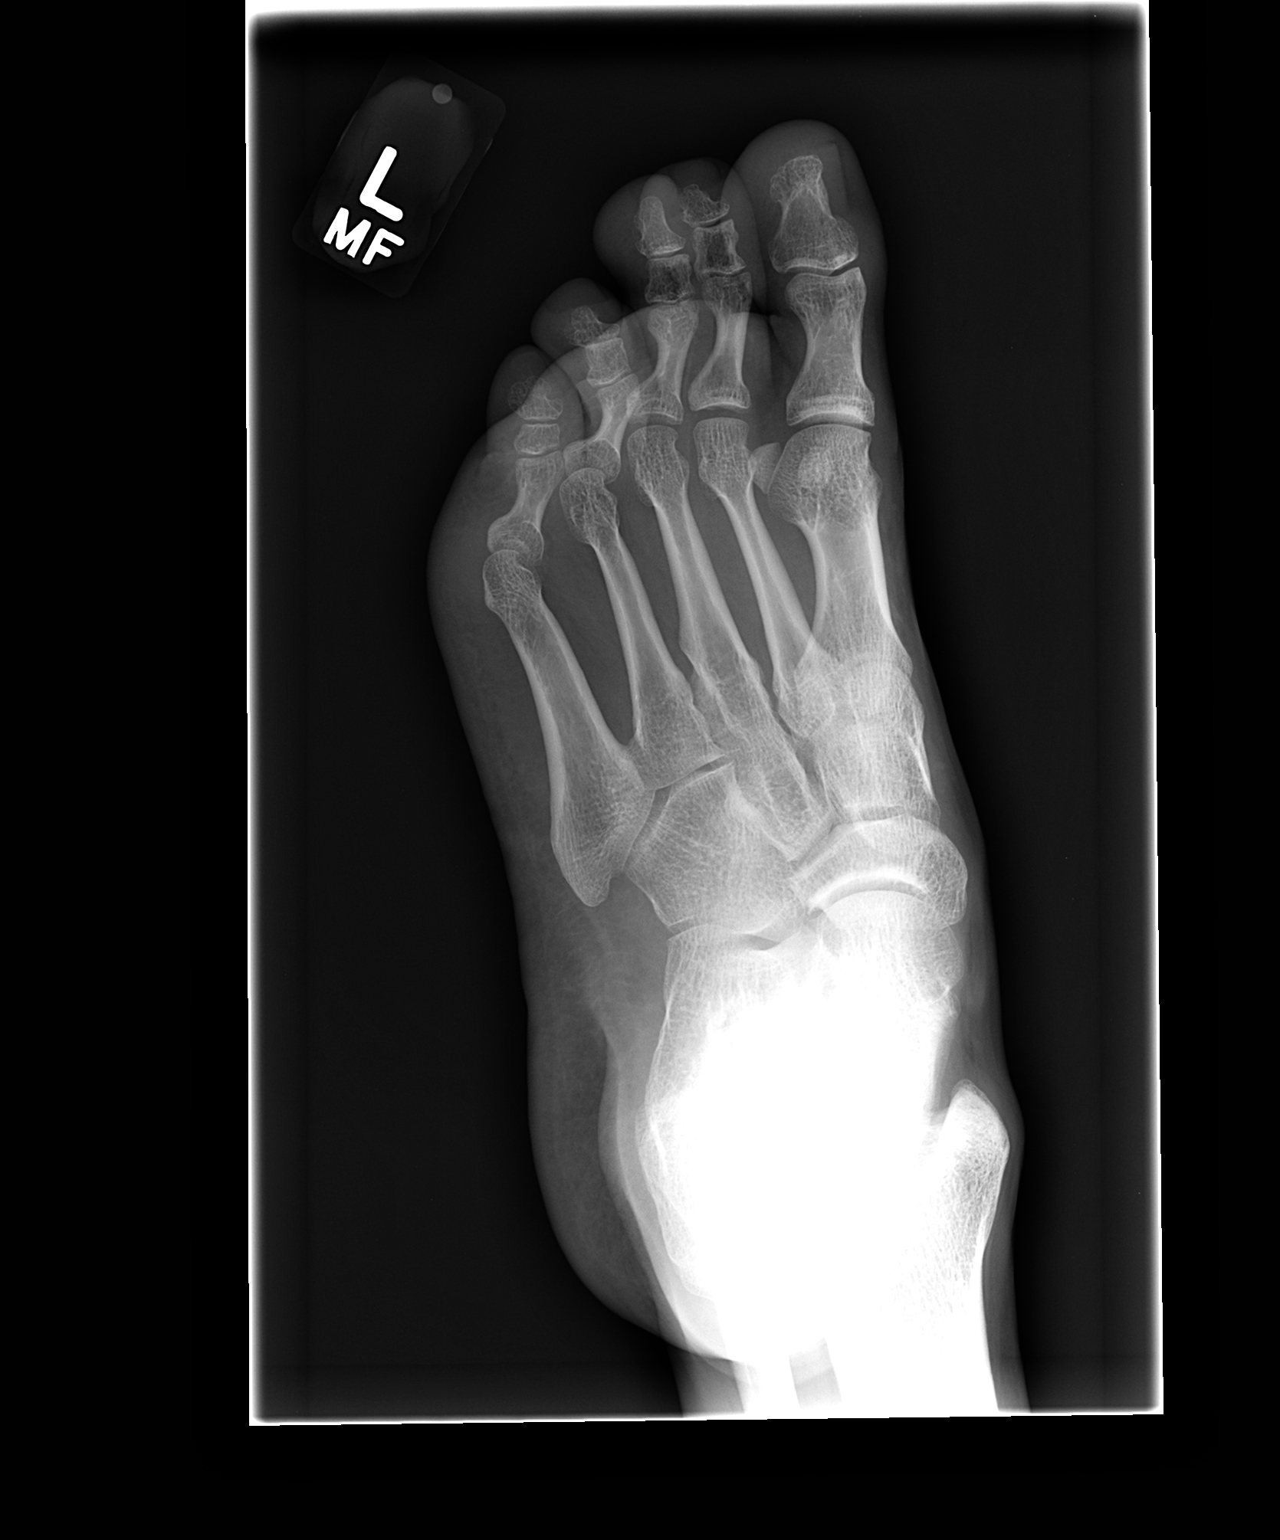

[view not recorded (3 of 3)]
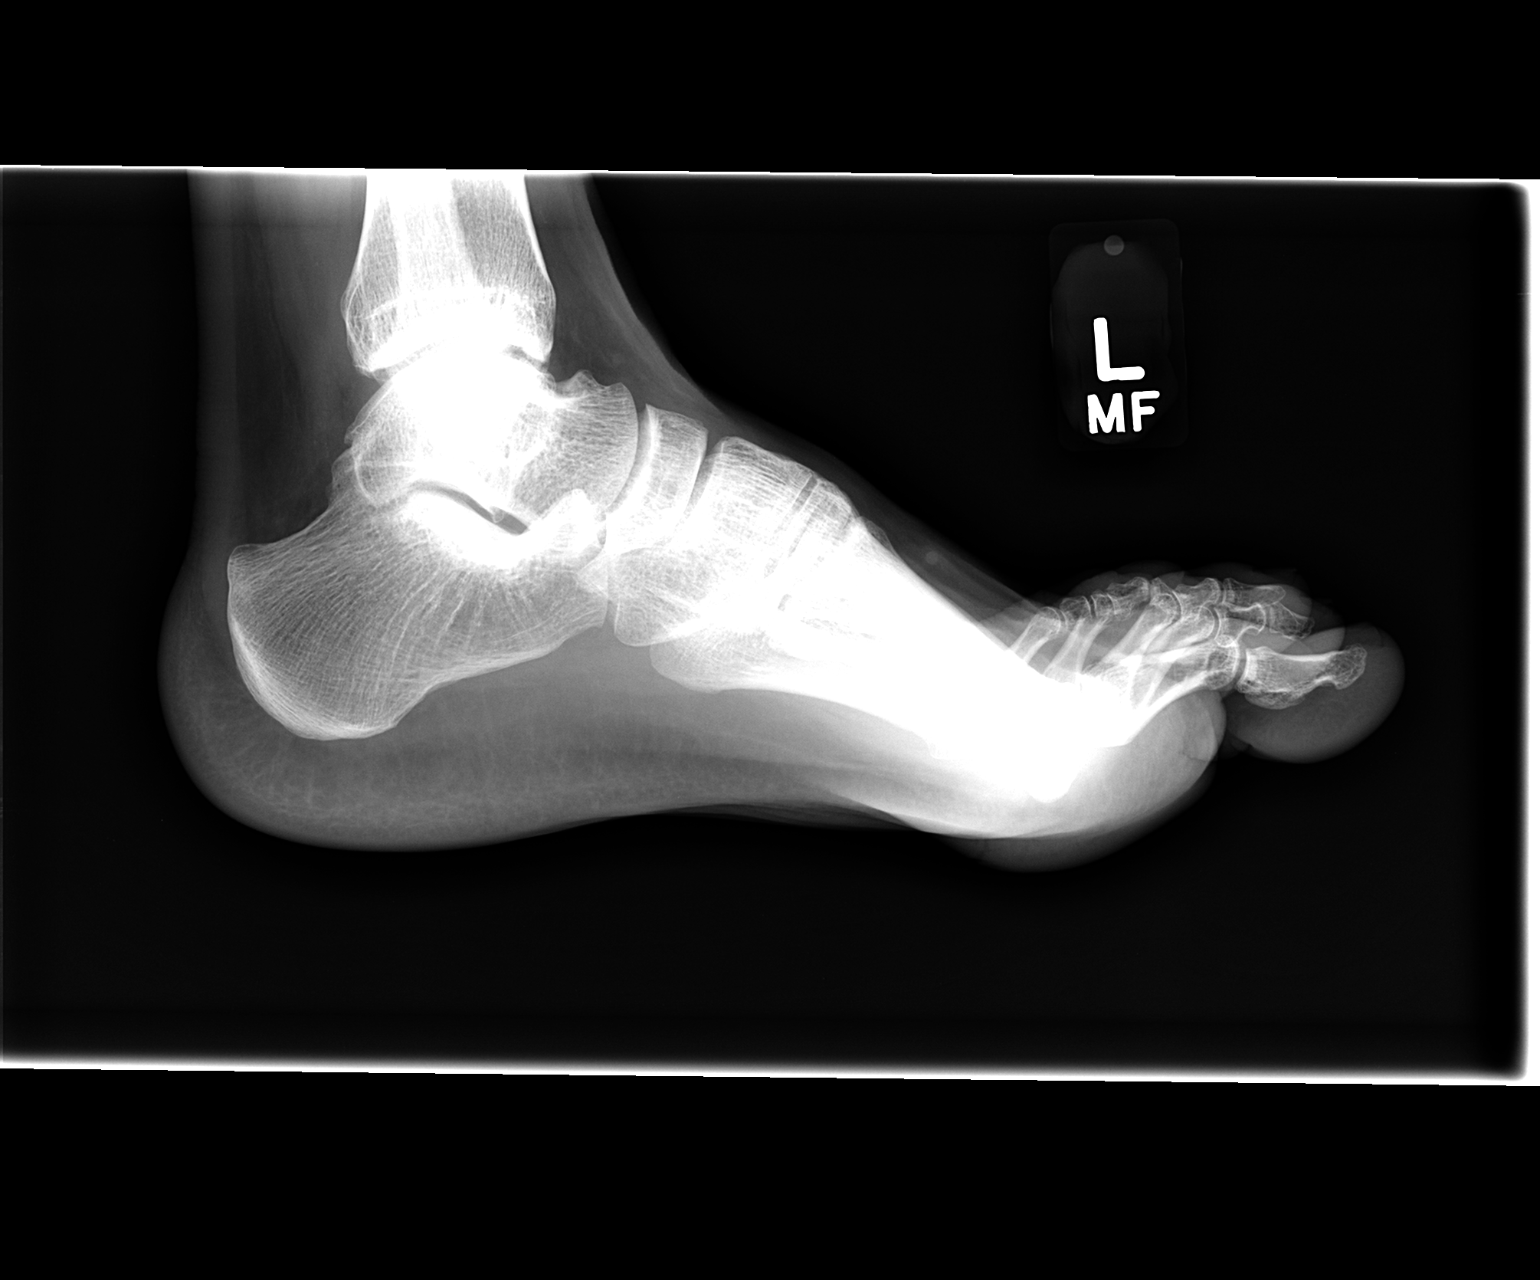

[3 of 3 positions shown; findings below may reference images not displayed]

FINDINGS: The bony structures are within normal limits. No acute fracture or
dislocation is noted. No radiopaque foreign body is seen.
IMPRESSION: No acute abnormality noted.

## 2016-02-03 ENCOUNTER — Other Ambulatory Visit: Payer: Self-pay | Admitting: Family Medicine

## 2016-02-04 MED ORDER — CETIRIZINE HCL 10 MG PO TABS
10.0000 mg | ORAL_TABLET | Freq: Every day | ORAL | Status: AC
Start: 2016-02-04 — End: ?

## 2016-02-04 NOTE — Addendum Note (Signed)
Addended by: Deno Etienne on: 02/04/2016 05:09 PM   Modules accepted: Orders

## 2016-02-04 NOTE — Telephone Encounter (Signed)
Pt has requested a refill too soon.

## 2016-03-06 ENCOUNTER — Telehealth: Payer: Self-pay | Admitting: Family Medicine

## 2016-03-06 DIAGNOSIS — R030 Elevated blood-pressure reading, without diagnosis of hypertension: Secondary | ICD-10-CM

## 2016-03-06 DIAGNOSIS — R7989 Other specified abnormal findings of blood chemistry: Secondary | ICD-10-CM

## 2016-03-06 DIAGNOSIS — E348 Other specified endocrine disorders: Secondary | ICD-10-CM

## 2016-03-06 NOTE — Telephone Encounter (Signed)
Patient called and request to get a lab order sent for next Tuesday. Thanks

## 2016-03-09 NOTE — Telephone Encounter (Signed)
Ok to recheck vit D and CMP and A1C

## 2016-03-09 NOTE — Telephone Encounter (Signed)
Labs ordered and faxed.Naylin Burkle [place]  

## 2016-03-10 NOTE — Telephone Encounter (Signed)
Notified patient that they are ordered.

## 2016-03-11 LAB — COMPLETE METABOLIC PANEL WITH GFR
ALBUMIN: 4.4 g/dL (ref 3.6–5.1)
ALK PHOS: 59 U/L (ref 40–115)
ALT: 19 U/L (ref 9–46)
AST: 16 U/L (ref 10–40)
BILIRUBIN TOTAL: 0.6 mg/dL (ref 0.2–1.2)
BUN: 14 mg/dL (ref 7–25)
CALCIUM: 9.4 mg/dL (ref 8.6–10.3)
CO2: 24 mmol/L (ref 20–31)
Chloride: 100 mmol/L (ref 98–110)
Creat: 0.76 mg/dL (ref 0.60–1.35)
GFR, Est African American: >89 mL/min (ref >=60)
Glucose, Bld: 87 mg/dL (ref 65–99)
POTASSIUM: 5.6 mmol/L — AB (ref 3.5–5.3)
Sodium: 137 mmol/L (ref 135–146)
TOTAL PROTEIN: 6.8 g/dL (ref 6.1–8.1)

## 2016-03-11 LAB — HEMOGLOBIN A1C
HEMOGLOBIN A1C: 6.1 % — AB (ref <5.7)
Mean Plasma Glucose: 128 mg/dL — ABNORMAL HIGH (ref <117)

## 2016-03-11 LAB — VITAMIN D 25 HYDROXY (VIT D DEFICIENCY, FRACTURES): VIT D 25 HYDROXY: 25 ng/mL — AB (ref 30–100)

## 2016-03-11 NOTE — Addendum Note (Signed)
Addended by: Wyline BeadyMCCRIMMON, Sreeja Spies C on: 03/11/2016 09:17 AM   Modules accepted: Orders

## 2016-03-24 LAB — POTASSIUM: Potassium: 4.3 mmol/L (ref 3.5–5.3)

## 2016-05-29 ENCOUNTER — Other Ambulatory Visit: Payer: Self-pay | Admitting: *Deleted

## 2016-05-29 MED ORDER — ATORVASTATIN CALCIUM 10 MG PO TABS: ORAL_TABLET | ORAL | Status: DC

## 2016-05-29 NOTE — Telephone Encounter (Signed)
Pt lvm stating that his [facility] and would like his lipitor sent there.George Young, George Young rx sent.George Young, George Young

## 2016-11-23 ENCOUNTER — Other Ambulatory Visit: Payer: Self-pay | Admitting: *Deleted

## 2016-11-23 ENCOUNTER — Telehealth: Payer: Self-pay | Admitting: Family Medicine

## 2016-11-23 DIAGNOSIS — R030 Elevated blood-pressure reading, without diagnosis of hypertension: Secondary | ICD-10-CM

## 2016-11-23 DIAGNOSIS — E7800 Pure hypercholesterolemia, unspecified: Secondary | ICD-10-CM

## 2016-11-23 DIAGNOSIS — Z0000 Encounter for general adult medical examination without abnormal findings: Secondary | ICD-10-CM

## 2016-11-23 DIAGNOSIS — R7301 Impaired fasting glucose: Secondary | ICD-10-CM

## 2016-11-23 DIAGNOSIS — R972 Elevated prostate specific antigen [PSA]: Secondary | ICD-10-CM

## 2016-11-23 NOTE — Telephone Encounter (Signed)
Patient called scheduled an appointment for 12/16/16 for a cpe and would like to get labs done the week before around Dec.13 or 14th. Thanks

## 2016-11-23 NOTE — Telephone Encounter (Signed)
Labs ordered and faxed pt informed..Arzu Mcgaughey [place]  

## 2016-12-09 LAB — CBC
HEMATOCRIT: 44.1 % (ref 38.5–50.0)
Hemoglobin: 14.8 g/dL (ref 13.2–17.1)
MCH: 30.6 pg (ref 27.0–33.0)
MCHC: 33.6 g/dL (ref 32.0–36.0)
MCV: 91.1 fL (ref 80.0–100.0)
MPV: 11.2 fL (ref 7.5–12.5)
PLATELETS: 251 10*3/uL (ref 140–400)
RBC: 4.84 MIL/uL (ref 4.20–5.80)
RDW: 13.9 % (ref 11.0–15.0)
WBC: 5.9 10*3/uL (ref 3.8–10.8)

## 2016-12-09 LAB — VITAMIN B12: VITAMIN B 12: 369 pg/mL (ref 200–1100)

## 2016-12-09 LAB — PSA: PSA: 6.1 ng/mL — ABNORMAL HIGH (ref <=4.0)

## 2016-12-09 LAB — TSH: TSH: 1.04 m[IU]/L (ref 0.40–4.50)

## 2016-12-10 LAB — COMPLETE METABOLIC PANEL WITH GFR
ALT: 15 U/L (ref 9–46)
AST: 13 U/L (ref 10–40)
Albumin: 4.5 g/dL (ref 3.6–5.1)
Alkaline Phosphatase: 66 U/L (ref 40–115)
BUN: 12 mg/dL (ref 7–25)
CALCIUM: 9.3 mg/dL (ref 8.6–10.3)
CHLORIDE: 103 mmol/L (ref 98–110)
CO2: 29 mmol/L (ref 20–31)
Creat: 0.77 mg/dL (ref 0.60–1.35)
Glucose, Bld: 115 mg/dL — ABNORMAL HIGH (ref 65–99)
Potassium: 4.4 mmol/L (ref 3.5–5.3)
Sodium: 140 mmol/L (ref 135–146)
Total Bilirubin: 0.5 mg/dL (ref 0.2–1.2)
Total Protein: 7 g/dL (ref 6.1–8.1)

## 2016-12-10 LAB — LIPID PANEL
CHOLESTEROL: 157 mg/dL (ref <200)
HDL: 41 mg/dL (ref >40)
LDL CALC: 99 mg/dL (ref <100)
TRIGLYCERIDES: 87 mg/dL (ref <150)
Total CHOL/HDL Ratio: 3.8 Ratio (ref <5.0)
VLDL: 17 mg/dL (ref <30)

## 2016-12-10 LAB — HEMOGLOBIN A1C
HEMOGLOBIN A1C: 6 % — AB (ref <5.7)
Mean Plasma Glucose: 126 mg/dL

## 2016-12-10 LAB — VITAMIN D 25 HYDROXY (VIT D DEFICIENCY, FRACTURES): Vit D, 25-Hydroxy: 33 ng/mL (ref 30–100)

## 2016-12-16 ENCOUNTER — Ambulatory Visit (INDEPENDENT_AMBULATORY_CARE_PROVIDER_SITE_OTHER): Payer: Self-pay | Admitting: Family Medicine

## 2016-12-16 ENCOUNTER — Encounter: Payer: Self-pay | Admitting: Family Medicine

## 2016-12-16 VITALS — BP 139/77 | HR 111 | Ht 66.0 in | Wt 155.0 lb

## 2016-12-16 DIAGNOSIS — L659 Nonscarring hair loss, unspecified: Secondary | ICD-10-CM

## 2016-12-16 DIAGNOSIS — R972 Elevated prostate specific antigen [PSA]: Secondary | ICD-10-CM

## 2016-12-16 DIAGNOSIS — Z0000 Encounter for general adult medical examination without abnormal findings: Secondary | ICD-10-CM

## 2016-12-16 MED ORDER — FLUTICASONE PROPIONATE 50 MCG/ACT NA SUSP: 2.0000 | Freq: Every day | NASAL | 6 refills | Status: DC

## 2016-12-16 MED ORDER — FINASTERIDE 1 MG PO TABS: 2.5000 mg | ORAL_TABLET | Freq: Every day | ORAL | 3 refills | Status: DC

## 2016-12-16 NOTE — Progress Notes (Signed)
Subjective:    Patient ID: George Young, male    DOB: 03/23/1966, 50 y.o.   MRN: [id number]  HPI Here for CPE.  He is doing well overall. He did change jobs and is working for [employer] in [employer]. They make airplane parts. He says the new job has been very stressful though. He's also been finishing up his online classes and will actually complete his degree at the end of January so he said he's been very sedentary over the last 6 months and has not been able to exercise.  He was also going to the [facility] in [place] and saw Dr. Jenell Milliner. They were previously riding his finasteride. Now that he is no longer going there he wants to know if I would be willing to take over his prescription.  Recent lab work did show an elevated PSA. He says he has not been going to the bathroom to urinate as frequently as he should at work and wonders if this could be contributing to his symptoms. Is not had any pain or discomfort.  Vitals:   12/16/16 0839  BP: 139/77  Pulse: (!) 111     Review of Systems   Comprehensive ROS of system.    BP 139/77   Pulse (!) 111   Ht _0  (1.676 m)   Wt 155 lb (70.3 kg)   SpO2 100%   BMI 25.02 kg/m     Allergies  Allergen Reactions  . Simvastatin     REACTION: Myalgias    Past Medical History:  Diagnosis Date  . Hiatal hernia   . Hyperlipemia   . Wrist fracture 2010    Past Surgical History:  Procedure Laterality Date  . ESOPHAGOGASTRODUODENOSCOPY    . LASIK  08/2013   Right    Social History   Social History  . Marital status: Married    Spouse name: N/A  . Number of children: N/A  . Years of education: N/A   Occupational History  .  [employer]   Social History Main Topics  . Smoking status: Never Smoker  . Smokeless tobacco: Not on file  . Alcohol use 0.0 oz/week     Comment: 2 per month  . Drug use: No  . Sexual activity: Yes    Partners: Female   Other Topics Concern  . Not on file   Social History  Narrative   Some exercise, usually 3 times per week. occ caffeine but not daily.      Family History  Problem Relation Age of Onset  . Hyperlipidemia Mother   . Heart disease Mother     Has pacemaker  . Irregular heart beat Mother     pacemaker  . Hypertension Father     Outpatient Encounter Prescriptions as of 12/16/2016  Medication Sig  . ACCU-CHEK FASTCLIX LANCETS MISC    . atorvastatin (LIPITOR) 10 MG tablet TAKE 1 TABLET DAILY  . Blood Glucose Monitoring Suppl (ACCU-CHEK ADVANTAGE DIABETES) kit Use as instructed   . cetirizine (ZYRTEC) 10 MG tablet Take 1 tablet (10 mg total) by mouth daily.  . fluticasone (FLONASE) 50 MCG/ACT nasal spray Place 2 sprays into both nostrils daily.  . ranitidine (ZANTAC) 150 MG capsule Take 1 capsule (150 mg total) by mouth 2 (two) times daily.  . [DISCONTINUED] fluticasone (FLONASE) 50 MCG/ACT nasal spray Place 2 sprays into both nostrils daily.  . finasteride (PROPECIA) 1 MG tablet Take 2.5 tablets (2.5 mg total) by  mouth daily.  . [DISCONTINUED] finasteride (PROPECIA) 1 MG tablet Take 5 mg by mouth daily. 1/2 tab daily   No facility-administered encounter medications on file as of 12/16/2016.           Objective:   Physical Exam  Constitutional: He is oriented to person, place, and time. He appears well-developed and well-nourished.  HENT:  Head: Normocephalic and atraumatic.  Right Ear: External ear normal.  Left Ear: External ear normal.  Nose: Nose normal.  Mouth/Throat: Oropharynx is clear and moist.  Eyes: Conjunctivae and EOM are normal. Pupils are equal, round, and reactive to light.  Neck: Normal range of motion. Neck supple. No thyromegaly present.  Cardiovascular: Normal rate, regular rhythm, normal heart sounds and intact distal pulses.   Pulmonary/Chest: Effort normal and breath sounds normal.  Abdominal: Soft. Bowel sounds are normal. He exhibits no distension and no mass. There is no tenderness. There is no rebound and  no guarding.  Musculoskeletal: Normal range of motion.  Lymphadenopathy:    He has no cervical adenopathy.  Neurological: He is alert and oriented to person, place, and time. He has normal reflexes.  Skin: Skin is warm and dry.  Psychiatric: He has a normal mood and affect. His behavior is normal. Judgment and thought content normal.       Assessment & Plan:  CPE -  Keep up a regular exercise program and make sure you are eating a healthy diet Try to eat 4 servings of dairy a day, or if you are lactose intolerant take a calcium with vitamin D daily.  Your vaccines are up to date.

## 2016-12-16 NOTE — Patient Instructions (Signed)
Keep up a regular exercise program and make sure you are eating a healthy diet Try to eat 4 servings of dairy a day, or if you are lactose intolerant take a calcium with vitamin D daily.  Your vaccines are up to date.   

## 2016-12-18 ENCOUNTER — Emergency Department
Admission: EM | Admit: 2016-12-18 | Discharge: 2016-12-18 | Disposition: A | Discharge status: Home / Self Care | Payer: Managed Care, Other (non HMO) | Source: Home / Self Care | Attending: Family Medicine | Admitting: Family Medicine

## 2016-12-18 ENCOUNTER — Encounter: Payer: Self-pay | Admitting: Emergency Medicine

## 2016-12-18 DIAGNOSIS — R197 Diarrhea, unspecified: Secondary | ICD-10-CM

## 2016-12-18 DIAGNOSIS — R112 Nausea with vomiting, unspecified: Secondary | ICD-10-CM | POA: Diagnosis not present

## 2016-12-18 MED ORDER — ONDANSETRON 4 MG PO TBDP
4.0000 mg | ORAL_TABLET | Freq: Once | ORAL | Status: AC
  Administered 2016-12-18: 4 mg via ORAL

## 2016-12-18 MED ORDER — ONDANSETRON 4 MG PO TBDP: ORAL_TABLET | ORAL | 0 refills | Status: DC

## 2016-12-18 NOTE — ED Provider Notes (Signed)
[facility]    CSN: [id number] Arrival date & time: 12/18/16  1456     History   Chief Complaint Chief Complaint  Patient presents with  . Emesis    HPI Ayeden Gladman is a 50 y.o. male.   At 10:30pm last night patient developed nausea/vomiting, and at 2am today he developed watery diarrhea with fatigue, myalgias, headache, and chills.  Denies recent foreign travel, or drinking untreated water in a wilderness environment.  He denies recent antibiotic use.    The history is provided by the patient.  Emesis  Severity:  Mild Duration:  1 day Quality:  Stomach contents Able to tolerate:  Liquids Progression:  Resolved Chronicity:  New Recent urination:  Normal Relieved by:  None tried Worsened by:  Nothing Ineffective treatments:  None tried Associated symptoms: chills, diarrhea and myalgias   Associated symptoms: no abdominal pain, no arthralgias, no cough, no fever, no headaches, no sore throat and no URI   Diarrhea:    Quality:  Watery   Severity:  Moderate   Duration:  1 day   Timing:  Intermittent   Progression:  Improving   Past Medical History:  Diagnosis Date  . Hiatal hernia   . Hyperlipemia   . Wrist fracture 2010    Patient Active Problem List   Diagnosis Date Noted  . Right knee pain 11/03/2013  . Ganglion cyst 11/03/2013  . Hiatal hernia 11/11/2012  . FATIGUE 08/08/2010  . PSA, INCREASED 08/08/2010  . SHOULDER PAIN, RIGHT 09/28/2008  . IFG (impaired fasting glucose) 09/26/2007  . HYPERCHOLESTEROLEMIA, PURE 07/12/2007  . ABNORMAL FINDINGS, ELEVATED BP W/O HTN 07/12/2007    Past Surgical History:  Procedure Laterality Date  . ESOPHAGOGASTRODUODENOSCOPY    . HAIR TRANSPLANT     Dr. Eual Fines  . LASIK  08/2013   Right       Home Medications    Prior to Admission medications   Medication Sig Start Date End Date Taking? Authorizing Provider  ACCU-CHEK FASTCLIX LANCETS [place]      Historical Provider, MD  atorvastatin  (LIPITOR) 10 MG tablet TAKE 1 TABLET DAILY 05/29/16   Hali Marry, MD  Blood Glucose Monitoring Suppl (ACCU-CHEK ADVANTAGE DIABETES) kit Use as instructed     Historical Provider, MD  cetirizine (ZYRTEC) 10 MG tablet Take 1 tablet (10 mg total) by mouth daily. 02/04/16   Hali Marry, MD  finasteride (PROPECIA) 1 MG tablet Take 2.5 tablets (2.5 mg total) by mouth daily. 12/16/16   Hali Marry, MD  fluticasone (FLONASE) 50 MCG/ACT nasal spray Place 2 sprays into both nostrils daily. 12/16/16   Hali Marry, MD  ondansetron (ZOFRAN ODT) 4 MG disintegrating tablet Take one tab by mouth Q6hr prn nausea.  Dissolve under tongue. 12/18/16   Kandra Nicolas, MD  ranitidine (ZANTAC) 150 MG capsule Take 1 capsule (150 mg total) by mouth 2 (two) times daily. 09/12/15   Hali Marry, MD    Family History Family History  Problem Relation Age of Onset  . Hyperlipidemia Mother   . Heart disease Mother     Has pacemaker  . Irregular heart beat Mother     pacemaker  . Hypertension Father     Social History Social History  Substance Use Topics  . Smoking status: Never Smoker  . Smokeless tobacco: Never Used  . Alcohol use 0.0 oz/week     Comment: 2 per month     Allergies   Simvastatin  Review of Systems Review of Systems  Constitutional: Positive for chills. Negative for fever.  HENT: Negative for sore throat.   Respiratory: Negative for cough.   Gastrointestinal: Positive for diarrhea and vomiting. Negative for abdominal pain.  Musculoskeletal: Positive for myalgias. Negative for arthralgias.  Neurological: Negative for headaches.  All other systems reviewed and are negative.    Physical Exam Triage Vital Signs ED Triage Vitals  Enc Vitals Group     BP 12/18/16 1547 130/82     Pulse Rate 12/18/16 1547 112     Resp --      Temp 12/18/16 1547 98.2 F (36.8 C)     Temp Source 12/18/16 1547 Oral     SpO2 12/18/16 1547 97 %     Weight 12/18/16  1548 148 lb (67.1 kg)     Height --      Head Circumference --      Peak Flow --      Pain Score 12/18/16 1549 0     Pain Loc --      Pain Edu? --      Excl. in [place]? --    No data found.   Updated Vital Signs BP 130/82 (BP Location: Right Arm)   Pulse 112   Temp 98.2 F (36.8 C) (Oral)   Wt 148 lb (67.1 kg)   SpO2 97%   BMI 23.89 kg/m   Visual Acuity Right Eye Distance:   Left Eye Distance:   Bilateral Distance:    Right Eye Near:   Left Eye Near:    Bilateral Near:     Physical Exam Nursing notes and Vital Signs reviewed. Appearance:  Patient appears stated age, and in no acute distress Eyes:  Pupils are equal, round, and reactive to light and accomodation.  Extraocular movement is intact.  Conjunctivae are not inflamed  Ears:  Canals normal.  Tympanic membranes normal.  Nose:  Normal turbinates.  No sinus tenderness.  Pharynx:  Normal; moist mucous membranes  Neck:  Supple.  No adenopathy. Lungs:  Clear to auscultation.  Breath sounds are equal.  Moving air well. Heart:  Regular rate and rhythm without murmurs, rubs, or gallops.  Abdomen:  Nontender without masses or hepatosplenomegaly.  Bowel sounds are present.  No CVA or flank tenderness.  Extremities:  No edema.  Skin:  No rash present.    UC Treatments / Results  Labs (all labs ordered are listed, but only abnormal results are displayed) Labs Reviewed - No data to display  EKG  EKG Interpretation None       Radiology No results found.  Procedures Procedures (including critical care time)  Medications Ordered in UC Medications  ondansetron (ZOFRAN-ODT) disintegrating tablet 4 mg (4 mg Oral Given 12/18/16 1550)     Initial Impression / Assessment and Plan / UC Course  I have reviewed the triage vital signs and the nursing notes.  Pertinent labs & imaging results that were available during my care of the patient were reviewed by me and considered in my medical decision making (see chart for  details).  Clinical Course   Suspect viral gastroenteritis. Administered Zofran ODT 76m PO; Given Rx for same. Begin clear liquids (Pedialyte while having diarrhea) until improved, then advance to a [employer](Bananas, Rice, Applesauce, Toast).  Then gradually resume a regular diet when tolerated.  Avoid milk products until well.  When stools become more formed, may take Imodium (loperamide) once or twice daily to decrease stool frequency.  If  symptoms become significantly worse during the night or over the weekend, proceed to the local emergency room.  Followup with Family Doctor if not improved in one week.      Final Clinical Impressions(s) / UC Diagnoses   Final diagnoses:  Non-intractable vomiting with nausea, unspecified vomiting type  Diarrhea of presumed infectious origin    New Prescriptions New Prescriptions   ONDANSETRON (ZOFRAN ODT) 4 MG DISINTEGRATING TABLET    Take one tab by mouth Q6hr prn nausea.  Dissolve under tongue.     Kandra Nicolas, MD 01/01/17 [phone]

## 2016-12-18 NOTE — ED Triage Notes (Signed)
Pt c/o N+V+D that started last night.

## 2016-12-18 NOTE — Discharge Instructions (Signed)
Begin clear liquids (Pedialyte while having diarrhea) until improved, then advance to a BRAT diet (Bananas, Rice, Applesauce, Toast).  Then gradually resume a regular diet when tolerated.  Avoid milk products until well.  When stools become more formed, may take Imodium (loperamide) once or twice daily to decrease stool frequency.  °If symptoms become significantly worse during the night or over the weekend, proceed to the local emergency room.  °

## 2016-12-22 ENCOUNTER — Other Ambulatory Visit: Payer: Self-pay

## 2016-12-22 MED ORDER — FINASTERIDE 5 MG PO TABS: 2.5000 mg | ORAL_TABLET | Freq: Every day | ORAL | 1 refills | Status: DC

## 2017-01-02 ENCOUNTER — Emergency Department
Admission: EM | Admit: 2017-01-02 | Discharge: 2017-01-02 | Disposition: A | Discharge status: Home / Self Care | Payer: Managed Care, Other (non HMO) | Source: Home / Self Care | Attending: Family Medicine | Admitting: Family Medicine

## 2017-01-02 ENCOUNTER — Encounter: Payer: Self-pay | Admitting: *Deleted

## 2017-01-02 DIAGNOSIS — J028 Acute pharyngitis due to other specified organisms: Secondary | ICD-10-CM

## 2017-01-02 LAB — POCT RAPID STREP A (OFFICE): Rapid Strep A Screen: NEGATIVE

## 2017-01-02 NOTE — Discharge Instructions (Signed)
As cold like symptoms develop, try the following: Take plain guaifenesin (1200mg  extended release tabs such as Mucinex) twice daily, with plenty of water, for cough and congestion.  May add Pseudoephedrine (30mg , one or two every 4 to 6 hours) for sinus congestion.  Get adequate rest.   May use Afrin nasal spray (or generic oxymetazoline) twice daily for about 5 days and then discontinue.  Also recommend using saline nasal spray several times daily and saline nasal irrigation (AYR is a common brand).  Use Flonase nasal spray each morning after using Afrin nasal spray and saline nasal irrigation. Try warm salt water gargles for sore throat.  Stop all antihistamines for now, and other non-prescription cough/cold preparations. May take Ibuprofen 200mg , 4 tabs every 8 hours with food for sore throat, headache, etc. May take Delsym Cough Suppressant at bedtime for nighttime cough.  Follow-up with family doctor if not improving about10 days.

## 2017-01-02 NOTE — ED Triage Notes (Signed)
Patient c/o 3 days of sore throat, HA and nasal congestion. Afebrile.

## 2017-01-02 NOTE — ED Provider Notes (Signed)
[facility]    CSN: [id number] Arrival date & time: 01/02/17  1027     History   Chief Complaint Chief Complaint  Patient presents with  . Sore Throat  . Nasal Congestion    HPI George Young is a 51 y.o. male.   Three days ago patient developed sore throat, nasal congestion, headache, and fatigue.  No cough.   The history is provided by the patient.    Past Medical History:  Diagnosis Date  . Hiatal hernia   . Hyperlipemia   . Wrist fracture 2010    Patient Active Problem List   Diagnosis Date Noted  . Right knee pain 11/03/2013  . Ganglion cyst 11/03/2013  . Hiatal hernia 11/11/2012  . FATIGUE 08/08/2010  . PSA, INCREASED 08/08/2010  . SHOULDER PAIN, RIGHT 09/28/2008  . IFG (impaired fasting glucose) 09/26/2007  . HYPERCHOLESTEROLEMIA, PURE 07/12/2007  . ABNORMAL FINDINGS, ELEVATED BP W/O HTN 07/12/2007    Past Surgical History:  Procedure Laterality Date  . ESOPHAGOGASTRODUODENOSCOPY    . HAIR TRANSPLANT     Dr. Eual Fines  . LASIK  08/2013   Right       Home Medications    Prior to Admission medications   Medication Sig Start Date End Date Taking? Authorizing Provider  ACCU-CHEK FASTCLIX LANCETS [place]      Historical Provider, MD  atorvastatin (LIPITOR) 10 MG tablet TAKE 1 TABLET DAILY 05/29/16   Hali Marry, MD  Blood Glucose Monitoring Suppl (ACCU-CHEK ADVANTAGE DIABETES) kit Use as instructed     Historical Provider, MD  cetirizine (ZYRTEC) 10 MG tablet Take 1 tablet (10 mg total) by mouth daily. 02/04/16   Hali Marry, MD  finasteride (PROSCAR) 5 MG tablet Take 0.5 tablets (2.5 mg total) by mouth daily. 12/22/16   Hali Marry, MD  fluticasone (FLONASE) 50 MCG/ACT nasal spray Place 2 sprays into both nostrils daily. 12/16/16   Hali Marry, MD  ondansetron (ZOFRAN ODT) 4 MG disintegrating tablet Take one tab by mouth Q6hr prn nausea.  Dissolve under tongue. 12/18/16   Kandra Nicolas, MD  ranitidine  (ZANTAC) 150 MG capsule Take 1 capsule (150 mg total) by mouth 2 (two) times daily. 09/12/15   Hali Marry, MD    Family History Family History  Problem Relation Age of Onset  . Hyperlipidemia Mother   . Heart disease Mother     Has pacemaker  . Irregular heart beat Mother     pacemaker  . Hypertension Father     Social History Social History  Substance Use Topics  . Smoking status: Never Smoker  . Smokeless tobacco: Never Used  . Alcohol use 0.0 oz/week     Comment: 2 per month     Allergies   Simvastatin   Review of Systems Review of Systems + sore throat No cough No pleuritic pain No wheezing + nasal congestion + post-nasal drainage No sinus pain/pressure No itchy/red eyes No earache No hemoptysis No SOB No fever/chills No nausea No vomiting No abdominal pain No diarrhea No urinary symptoms No skin rash + fatigue No myalgias + headache Used OTC meds without relief   Physical Exam Triage Vital Signs ED Triage Vitals  Enc Vitals Group     BP 01/02/17 1102 126/83     Pulse Rate 01/02/17 1102 80     Resp --      Temp 01/02/17 1102 98.2 F (36.8 C)     Temp Source 01/02/17 1102  Oral     SpO2 01/02/17 1102 98 %     Weight 01/02/17 1102 150 lb (68 kg)     Height --      Head Circumference --      Peak Flow --      Pain Score 01/02/17 1103 0     Pain Loc --      Pain Edu? --      Excl. in [place]? --    No data found.   Updated Vital Signs BP 126/83 (BP Location: Left Arm)   Pulse 80   Temp 98.2 F (36.8 C) (Oral)   Wt 150 lb (68 kg)   SpO2 98%   BMI 24.21 kg/m   Visual Acuity Right Eye Distance:   Left Eye Distance:   Bilateral Distance:    Right Eye Near:   Left Eye Near:    Bilateral Near:     Physical Exam Nursing notes and Vital Signs reviewed. Appearance:  Patient appears stated age, and in no acute distress Eyes:  Pupils are equal, round, and reactive to light and accomodation.  Extraocular movement is intact.   Conjunctivae are not inflamed  Ears:  Canals normal.  Tympanic membranes normal.  Nose:  Mildly congested turbinates.  No sinus tenderness.   Pharynx:  Uvula slightly erythematous Neck:  Supple.  Tender enlarged posterior/lateral nodes are palpated bilaterally  Lungs:  Clear to auscultation.  Breath sounds are equal.  Moving air well. Heart:  Regular rate and rhythm without murmurs, rubs, or gallops.  Abdomen:  Nontender without masses or hepatosplenomegaly.  Bowel sounds are present.  No CVA or flank tenderness.  Extremities:  No edema.  Skin:  No rash present.    UC Treatments / Results  Labs (all labs ordered are listed, but only abnormal results are displayed) Labs Reviewed  STREP A DNA PROBE  POCT RAPID STREP A (OFFICE) negative    EKG  EKG Interpretation None       Radiology No results found.  Procedures Procedures (including critical care time)  Medications Ordered in UC Medications - No data to display   Initial Impression / Assessment and Plan / UC Course  I have reviewed the triage vital signs and the nursing notes.  Pertinent labs & imaging results that were available during my care of the patient were reviewed by me and considered in my medical decision making (see chart for details).  Clinical Course   There is no evidence of bacterial infection today.  Suspect early viral URI.  CENTOR 1 Throat culture pending. As cold like symptoms develop, try the following: Take plain guaifenesin (1282m extended release tabs such as Mucinex) twice daily, with plenty of water, for cough and congestion.  May add Pseudoephedrine (348m one or two every 4 to 6 hours) for sinus congestion.  Get adequate rest.   May use Afrin nasal spray (or generic oxymetazoline) twice daily for about 5 days and then discontinue.  Also recommend using saline nasal spray several times daily and saline nasal irrigation (AYR is a common brand).  Use Flonase nasal spray each morning after using  Afrin nasal spray and saline nasal irrigation. Try warm salt water gargles for sore throat.  Stop all antihistamines for now, and other non-prescription cough/cold preparations. May take Ibuprofen 20082m4 tabs every 8 hours with food for sore throat, headache, etc. May take Delsym Cough Suppressant at bedtime for nighttime cough.  Follow-up with family doctor if not improving about10 days.  Final Clinical Impressions(s) / UC Diagnoses   Final diagnoses:  Acute pharyngitis due to other specified organisms    New Prescriptions New Prescriptions   No medications on file     Kandra Nicolas, MD 01/19/17 1656

## 2017-01-03 LAB — STREP A DNA PROBE: GASP: NOT DETECTED

## 2017-01-04 ENCOUNTER — Telehealth: Payer: Self-pay

## 2017-01-04 NOTE — Telephone Encounter (Signed)
Spoke with patient, feeling better.  Gave results of lab.

## 2017-06-17 ENCOUNTER — Other Ambulatory Visit: Payer: Self-pay | Admitting: *Deleted

## 2017-06-17 MED ORDER — ATORVASTATIN CALCIUM 10 MG PO TABS: ORAL_TABLET | ORAL | 3 refills | Status: DC

## 2017-10-06 ENCOUNTER — Telehealth: Payer: Self-pay | Admitting: Family Medicine

## 2017-10-06 DIAGNOSIS — Z0000 Encounter for general adult medical examination without abnormal findings: Secondary | ICD-10-CM

## 2017-10-06 DIAGNOSIS — R7301 Impaired fasting glucose: Secondary | ICD-10-CM

## 2017-10-06 DIAGNOSIS — R972 Elevated prostate specific antigen [PSA]: Secondary | ICD-10-CM

## 2017-10-06 DIAGNOSIS — E7800 Pure hypercholesterolemia, unspecified: Secondary | ICD-10-CM

## 2017-10-06 NOTE — Telephone Encounter (Signed)
Called and lvm informing pt that labs are ordered.George Young

## 2017-10-06 NOTE — Telephone Encounter (Signed)
Pt has scheduled a physical for Oct 17th and wants lab order sent down on Friday.  Thanks.

## 2017-10-09 LAB — LIPID PANEL W/REFLEX DIRECT LDL
CHOL/HDL RATIO: 3.8 (calc) (ref <5.0)
CHOLESTEROL: 186 mg/dL (ref <200)
HDL: 49 mg/dL (ref >40)
LDL CHOLESTEROL (CALC): 112 mg/dL — AB
Non-HDL Cholesterol (Calc): 137 mg/dL (calc) — ABNORMAL HIGH (ref <130)
TRIGLYCERIDES: 135 mg/dL (ref <150)

## 2017-10-09 LAB — COMPLETE METABOLIC PANEL WITH GFR
AG Ratio: 1.9 (calc) (ref 1.0–2.5)
ALBUMIN MSPROF: 4.4 g/dL (ref 3.6–5.1)
ALKALINE PHOSPHATASE (APISO): 68 U/L (ref 40–115)
ALT: 18 U/L (ref 9–46)
AST: 14 U/L (ref 10–35)
BUN: 11 mg/dL (ref 7–25)
CO2: 30 mmol/L (ref 20–32)
CREATININE: 0.8 mg/dL (ref 0.70–1.33)
Calcium: 9.3 mg/dL (ref 8.6–10.3)
Chloride: 103 mmol/L (ref 98–110)
GFR, Est African American: 121 mL/min/{1.73_m2} (ref >=60)
GFR, Est Non African American: 104 mL/min/{1.73_m2} (ref >=60)
GLUCOSE: 120 mg/dL — AB (ref 65–99)
Globulin: 2.3 g/dL (calc) (ref 1.9–3.7)
Potassium: 4.2 mmol/L (ref 3.5–5.3)
Sodium: 140 mmol/L (ref 135–146)
TOTAL PROTEIN: 6.7 g/dL (ref 6.1–8.1)
Total Bilirubin: 0.5 mg/dL (ref 0.2–1.2)

## 2017-10-09 LAB — CBC
HEMATOCRIT: 44.1 % (ref 38.5–50.0)
Hemoglobin: 14.9 g/dL (ref 13.2–17.1)
MCH: 30.5 pg (ref 27.0–33.0)
MCHC: 33.8 g/dL (ref 32.0–36.0)
MCV: 90.4 fL (ref 80.0–100.0)
MPV: 11.7 fL (ref 7.5–12.5)
PLATELETS: 254 10*3/uL (ref 140–400)
RBC: 4.88 10*6/uL (ref 4.20–5.80)
RDW: 13 % (ref 11.0–15.0)
WBC: 6 10*3/uL (ref 3.8–10.8)

## 2017-10-09 LAB — PSA: PSA: 1.9 ng/mL (ref <=4.0)

## 2017-10-09 LAB — HEMOGLOBIN A1C
Hgb A1c MFr Bld: 6.1 % of total Hgb — ABNORMAL HIGH (ref <5.7)
MEAN PLASMA GLUCOSE: 128 (calc)
eAG (mmol/L): 7.1 (calc)

## 2017-10-09 LAB — VITAMIN B12: Vitamin B-12: 306 pg/mL (ref 200–1100)

## 2017-10-09 LAB — VITAMIN D 25 HYDROXY (VIT D DEFICIENCY, FRACTURES): Vit D, 25-Hydroxy: 25 ng/mL — ABNORMAL LOW (ref 30–100)

## 2017-10-13 ENCOUNTER — Encounter: Payer: Self-pay | Admitting: Family Medicine

## 2017-10-13 ENCOUNTER — Ambulatory Visit (INDEPENDENT_AMBULATORY_CARE_PROVIDER_SITE_OTHER): Payer: 59 | Admitting: Family Medicine

## 2017-10-13 VITALS — BP 122/64 | HR 78 | Ht 66.0 in | Wt 157.0 lb

## 2017-10-13 DIAGNOSIS — Z0000 Encounter for general adult medical examination without abnormal findings: Secondary | ICD-10-CM | POA: Diagnosis not present

## 2017-10-13 DIAGNOSIS — L989 Disorder of the skin and subcutaneous tissue, unspecified: Secondary | ICD-10-CM

## 2017-10-13 DIAGNOSIS — Z23 Encounter for immunization: Secondary | ICD-10-CM

## 2017-10-13 DIAGNOSIS — R195 Other fecal abnormalities: Secondary | ICD-10-CM | POA: Diagnosis not present

## 2017-10-13 DIAGNOSIS — Z125 Encounter for screening for malignant neoplasm of prostate: Secondary | ICD-10-CM

## 2017-10-13 LAB — POC HEMOCCULT BLD/STL (OFFICE/1-CARD/DIAGNOSTIC)
FECAL OCCULT BLD: POSITIVE — AB
OCCULT BLOOD DATE: 10172018

## 2017-10-13 NOTE — Progress Notes (Signed)
Subjective:    Patient ID: George Young, male    DOB: [birth year], 51 y.o.   MRN: [id number]  HPI 51 year old male here today for complete physical exam. He had his lab work drawn last week. He has not been exercising as consistently since last spring. And admits he hasn't been as good with his diet.  Work is been very stressful. He got a new job about 18 months ago but he's been working long hours and it's very stressful. He asked she just finished getting his 4 year college degree back in January. He is starting to look for a new job. He feels like this is most of what is affecting his mood overall.   Review of Systems  Comprehensive review of systems is negative.  BP 122/64   Pulse 78   Ht 5' 6" (1.676 m)   Wt 157 lb (71.2 kg)   SpO2 99%   BMI 25.34 kg/m     Allergies  Allergen Reactions  . Simvastatin     REACTION: Myalgias    Past Medical History:  Diagnosis Date  . Hiatal hernia   . Hyperlipemia   . Wrist fracture 2010    Past Surgical History:  Procedure Laterality Date  . ESOPHAGOGASTRODUODENOSCOPY    . HAIR TRANSPLANT     Dr. Eual Fines  . LASIK  08/2013   Right    Social History   Social History  . Marital status: Married    Spouse name: Judson Roch  . Number of children: N/A  . Years of education: N/A   Occupational History  .      [employer]   Social History Main Topics  . Smoking status: Never Smoker  . Smokeless tobacco: Never Used  . Alcohol use 0.0 oz/week     Comment: 2 per month  . Drug use: No  . Sexual activity: Yes    Partners: Female   Other Topics Concern  . Not on file   Social History Narrative   Some exercise, usually 3 times per week. occ caffeine but not daily.  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     Family History  Problem Relation Age of Onset  . Hyperlipidemia Mother   . Heart disease Mother        Has pacemaker  . Irregular heart beat Mother        pacemaker  . Hypertension Father     Outpatient  Encounter Prescriptions as of 10/13/2017  Medication Sig  . ACCU-CHEK FASTCLIX LANCETS MISC    . atorvastatin (LIPITOR) 10 MG tablet TAKE 1 TABLET DAILY  . Blood Glucose Monitoring Suppl (ACCU-CHEK ADVANTAGE DIABETES) kit Use as instructed   . cetirizine (ZYRTEC) 10 MG tablet Take 1 tablet (10 mg total) by mouth daily.  . finasteride (PROSCAR) 5 MG tablet Take 0.5 tablets (2.5 mg total) by mouth daily.  . fluticasone (FLONASE) 50 MCG/ACT nasal spray Place 2 sprays into both nostrils daily.  . ranitidine (ZANTAC) 150 MG capsule Take 1 capsule (150 mg total) by mouth 2 (two) times daily.  . [DISCONTINUED] ondansetron (ZOFRAN ODT) 4 MG disintegrating tablet Take one tab by mouth Q6hr prn nausea.  Dissolve under tongue.   No facility-administered encounter medications on file as of 10/13/2017.           Objective:   Physical Exam  Constitutional: He is oriented to person, place, and time. He appears well-developed and well-nourished.  HENT:  Head: Normocephalic and atraumatic.  Right Ear: External  ear normal.  Left Ear: External ear normal.  Nose: Nose normal.  Mouth/Throat: Oropharynx is clear and moist.  Eyes: Pupils are equal, round, and reactive to light. Conjunctivae and EOM are normal.  Neck: Normal range of motion. Neck supple. No thyromegaly present.  Cardiovascular: Normal rate, regular rhythm, normal heart sounds and intact distal pulses.   Pulmonary/Chest: Effort normal and breath sounds normal.  Abdominal: Soft. Bowel sounds are normal. He exhibits no distension and no mass. There is no tenderness. There is no rebound and no guarding.  Genitourinary: Rectum normal. Rectal exam shows no external hemorrhoid, no mass, no tenderness, anal tone normal and guaiac negative stool. Prostate is enlarged.  Genitourinary Comments: Mildly enlarged.    Musculoskeletal: Normal range of motion.  Lymphadenopathy:    He has no cervical adenopathy.  Neurological: He is alert and oriented to  person, place, and time. He has normal reflexes.  Skin: Skin is warm and dry.  Psychiatric: He has a normal mood and affect. His behavior is normal. Judgment and thought content normal.        Assessment & Plan:  CPE Keep up a regular exercise program and make sure you are eating a healthy diet Try to eat 4 servings of dairy a day, or if you are lactose intolerant take a calcium with vitamin D daily.  Your vaccines are up to date.   IFG - Well controlled. Continue current regimen. Follow up in  6 months.   Person to get back on track with diet and exercise.  PHQ 9 score of 8 and GAD 7 score of 3 today.  Tdap given today.   Discussed need for colon cancer screening. He is interested in doing, guard will have form completed and faxed did encourage him to check with his insurance on coverage.  Is also requesting referral to dermatology but does want to be able to check with his insurance as he was covered in his network.  Acute stress-PHQ 9 score of 8 today. He feels like this is directly related to was going on at work. He is currently looking for a new job as he does feel like it's starting to affect him some. He also plans on getting back into his regular exercise routine and eating healthy again. We'll keep an eye on this and plan to recheck again when I see him back in 6 months. Certainly encouraged him to call sooner if he feels like he is getting worse.  Guaiac positive stool on rectal exam today. Will have him try to do the color guard test. If he is unable to do so then we'll need full colonoscopy.

## 2017-10-13 NOTE — Patient Instructions (Addendum)

## 2017-10-29 ENCOUNTER — Telehealth: Payer: Self-pay | Admitting: Family Medicine

## 2017-10-29 NOTE — Telephone Encounter (Signed)
Patient called adv that he has not heard or received anything regarding cologuard kit being mailed to him. Pt adv his insurance will cover it. Pt also was calling about Derm referral and I adv him that we have been waiting for him to call back with preferred providers in his network he will call back to give the preferred provider. Thanks

## 2017-11-02 NOTE — Telephone Encounter (Signed)
I called [facility] and they did not receive an order. I will fax another order.

## 2017-11-02 NOTE — Telephone Encounter (Signed)
Order faxed . Confirmation received.[name].[name].George PacasBarkley, Tabetha Haraway [place]

## 2017-11-27 LAB — COLOGUARD: Cologuard: NEGATIVE

## 2017-11-29 ENCOUNTER — Telehealth: Payer: Self-pay | Admitting: Family Medicine

## 2017-11-29 NOTE — Telephone Encounter (Signed)
Call patient: Your Cologuard is negative.  Repeat colon cancer screening in 3 years.

## 2017-11-29 NOTE — Telephone Encounter (Signed)
lvm w/resluts.George PacasBarkley, George Gladwin PalmerLynetta

## 2017-12-11 ENCOUNTER — Encounter: Payer: Self-pay | Admitting: Family Medicine

## 2017-12-11 NOTE — Progress Notes (Signed)
11/27/17 

## 2017-12-25 ENCOUNTER — Other Ambulatory Visit: Payer: Self-pay | Admitting: Family Medicine

## 2018-02-28 ENCOUNTER — Other Ambulatory Visit: Payer: Self-pay | Admitting: *Deleted

## 2018-02-28 DIAGNOSIS — E7800 Pure hypercholesterolemia, unspecified: Secondary | ICD-10-CM

## 2018-02-28 MED ORDER — ATORVASTATIN CALCIUM 10 MG PO TABS: ORAL_TABLET | ORAL | 3 refills | Status: DC

## 2018-04-04 ENCOUNTER — Ambulatory Visit (INDEPENDENT_AMBULATORY_CARE_PROVIDER_SITE_OTHER): Payer: 59 | Admitting: Family Medicine

## 2018-04-04 ENCOUNTER — Encounter: Payer: Self-pay | Admitting: Family Medicine

## 2018-04-04 VITALS — BP 144/80 | HR 78 | Ht 66.0 in | Wt 163.0 lb

## 2018-04-04 DIAGNOSIS — M79672 Pain in left foot: Secondary | ICD-10-CM

## 2018-04-04 MED ORDER — DICLOFENAC SODIUM 1 % TD GEL: 2.0000 g | Freq: Four times a day (QID) | TRANSDERMAL | 11 refills | Status: DC

## 2018-04-04 NOTE — Patient Instructions (Signed)
Thank you for coming in today. Do the exercises for peroneal tendonitis.  If not getting better next step is rechecking 3 weeks and consider injection vs footear modification.  Use the gel up to 4x daily.      Peroneal Tendinopathy Rehab Ask your health care provider which exercises are safe for you. Do exercises exactly as told by your health care provider and adjust them as directed. It is normal to feel mild stretching, pulling, tightness, or discomfort as you do these exercises, but you should stop right away if you feel sudden pain or your pain gets worse.Do not begin these exercises until told by your health care provider. Stretching and range of motion exercises These exercises warm up your muscles and joints and improve the movement and flexibility of your ankle. These exercises also help to relieve pain and stiffness. Exercise A: Gastroc and soleus, standing 1. Stand on the edge of a step on the balls of your feet. The ball of your foot is on the walking surface, right under your toes. 2. Hold onto the railing for balance. 3. Slowly lift your left / right foot, allowing your body weight to press your left / right heel down over the edge of the step. You should feel a stretch in your left / right calf. 4. Hold this position for __________ seconds. Repeat __________ times with your left / right knee straight and __________ times with your left / right knee bent. Complete this stretch __________ times per day. Strengthening exercises These exercises improve the strength and endurance of your foot and ankle. Endurance is the ability to use your muscles for a long time, even after they get tired. Exercise B: Dorsiflexors  1. Secure a rubber exercise band or tube to an object, like a table leg, that will not move if it is pulled on. 2. Secure the other end of the band around your left / right foot. 3. Sit on the floor, facing the object with your left / right foot extended. The band or  tube should be slightly tense when your foot is relaxed. 4. Slowly flex your left / right ankle and toes to bring your foot toward you. 5. Hold this position for __________ seconds. 6. Slowly return your foot to the starting position. Repeat __________ times. Complete this exercise __________ times per day. Exercise C: Evertors 1. Sit on the floor with your legs straight out in front of you. 2. Loop a rubber exercise or band or tube around the ball of your left / right foot. The ball of your foot is on the walking surface, right under your toes. 3. Hold the ends of the band in your hands, or secure the band to a stable object. 4. Slowly push your foot outward, away from your other leg. 5. Hold this position for __________ seconds. 6. Slowly return your foot to the starting position. Repeat __________ times. Complete this exercise __________ times per day. Exercise D: Standing heel raise ( plantar flexion) 1. Stand with your feet shoulder-width apart with the balls of your feet on a step. The ball of your foot is on the walking surface, right under your toes. 2. Keep your weight spread evenly over the width of your feet while you rise up on your toes. Use a wall or railing to steady yourself, but try not to use it for support. 3. If this exercise is too easy, try these options: ? Shift your weight toward your left / right leg until  you feel challenged. ? If told by your health care provider, stand on your left / right leg only. 4. Hold this position for __________ seconds. Repeat __________ times. Complete this exercise __________ times per day. Exercise E: Single leg stand 1. Without shoes, stand near a railing or in a doorway. You may hold onto the railing or door frame as needed. 2. Stand on your left / right foot. Keep your big toe down on the floor and try to keep your arch lifted. ? Do not roll to the outside of your foot. ? If this exercise is too easy, you can try it with your eyes  closed or while standing on a pillow. 3. Hold this position for __________ seconds. Repeat __________ times. Complete this exercise __________ times per day. This information is not intended to replace advice given to you by your health care provider. Make sure you discuss any questions you have with your health care provider. Document Released: 12/14/2005 Document Revised: 08/20/2016 Document Reviewed: 11/02/2015 Elsevier Interactive Patient Education  [employer].

## 2018-04-04 NOTE — Progress Notes (Signed)
George Young is a 52 y.o. male [facility] today for evaluation of left foot pain.   The patient states he was working out performing calf raises last Tuesday without pain. The following morning he was experiencing mild lateral left foot pain that worsened with use. The pain has been present since last Wednesday and has not improved. It gets better with rest and worse with movement. It has affected his gait. He has been taking Naproxen nightly and using warm compresses with little relief.  He denies any radiating pain weakness or numbness.   Past Medical History:  Diagnosis Date  . Hiatal hernia   . Hyperlipemia   . Wrist fracture 2010   Past Surgical History:  Procedure Laterality Date  . ESOPHAGOGASTRODUODENOSCOPY    . HAIR TRANSPLANT     Dr. Denna HaggardAngela Phipps  . LASIK  08/2013   Right   Social History   Tobacco Use  . Smoking status: Never Smoker  . Smokeless tobacco: Never Used  Substance Use Topics  . Alcohol use: Yes    Alcohol/week: 0.0 oz    Comment: 2 per month   family history includes Heart disease in his mother; Hyperlipidemia in his mother; Hypertension in his father; Irregular heart beat in his mother.  ROS as above:  Medications: Current Outpatient Medications  Medication Sig Dispense Refill  . atorvastatin (LIPITOR) 10 MG tablet TAKE 1 TABLET DAILY 90 tablet 3  . cetirizine (ZYRTEC) 10 MG tablet Take 1 tablet (10 mg total) by mouth daily. 365 tablet 0  . finasteride (PROSCAR) 5 MG tablet TAKE ONE-HALF TABLET BY MOUTH ONCE DAILY 45 tablet 3  . fluticasone (FLONASE) 50 MCG/ACT nasal spray Place 2 sprays into both nostrils daily. 16 g 6  . ranitidine (ZANTAC) 150 MG capsule Take 1 capsule (150 mg total) by mouth 2 (two) times daily. 60 capsule 5  . diclofenac sodium (VOLTAREN) 1 % GEL Apply 2 g topically 4 (four) times daily. To  affected joint. 100 g 11   No current facility-administered medications for this visit.    Allergies  Allergen Reactions  . Simvastatin     REACTION: Myalgias    Health Maintenance Health Maintenance  Topic Date Due  . INFLUENZA VACCINE  03/27/2024 (Originally 07/28/2018)  . Fecal DNA (Cologuard)  11/27/2020  . TETANUS/TDAP  10/14/2027  . HIV Screening  Completed     Exam:  BP (!) 144/80   Pulse 78   Ht 5\' 6"  (1.676 m)   Wt 163 lb (73.9 kg)   BMI 26.31 kg/m  Gen: Well NAD HEENT: EOMI,  MMM Lungs: Normal work of breathing. CTABL Heart: RRR no MRG Abd: NABS, Soft. Nondistended, Nontender Exts: Brisk capillary refill, warm and well perfused.  MSK:   Left foot:  The foot is non-erythematous with no obvious deformities or effusions.  Tender to palpation fifth MTP at the lateral aspect and plantar aspect. Mildly tender to palpation along the course of the peroneal tendons onto the insertion at the proximal fifth metatarsal  ROM is intact and strength is 5/5, but ankle eversion against resistance elicits mild pain.  Stable ligamentous exam. Pulses capillary refill and sensation are intact  Limited musculoskeletal ultrasound of the peroneal tendons reveal mild hypoechoic fluid surrounding the tendon sheaths at the ankle joint without any obvious tears or disruption of the tendon fibers. Bony structures are normal otherwise.    Assessment and Plan: 52  y.o. male with left foot pain.   Pain likely due to metatarsalgia and peroneal tendinitis.  Plan for diclofenac gel home eccentric exercises and fifth metatarsal post.  Patient should report back if the symptoms do not improve or worsen. He can consider an Cam walker boot if the pain worsens so that he can not walk at all.    No orders of the defined types were placed in this encounter.  Meds ordered this encounter  Medications  . diclofenac sodium (VOLTAREN) 1 % GEL    Sig: Apply 2 g topically 4 (four) times daily. To  affected joint.    Dispense:  100 g    Refill:  11     Discussed warning signs or symptoms. Please see discharge instructions. Patient expresses understanding.

## 2018-04-04 NOTE — Progress Notes (Signed)
Note duplicated in error.

## 2018-04-05 ENCOUNTER — Ambulatory Visit: Payer: 59 | Admitting: Sports Medicine

## 2018-04-13 ENCOUNTER — Encounter: Payer: Self-pay | Admitting: Family Medicine

## 2018-04-13 ENCOUNTER — Ambulatory Visit (INDEPENDENT_AMBULATORY_CARE_PROVIDER_SITE_OTHER): Payer: 59 | Admitting: Family Medicine

## 2018-04-13 ENCOUNTER — Ambulatory Visit: Payer: 59 | Admitting: Family Medicine

## 2018-04-13 VITALS — BP 134/66 | HR 66 | Ht 66.0 in | Wt 160.0 lb

## 2018-04-13 DIAGNOSIS — N401 Enlarged prostate with lower urinary tract symptoms: Secondary | ICD-10-CM | POA: Diagnosis not present

## 2018-04-13 DIAGNOSIS — J309 Allergic rhinitis, unspecified: Secondary | ICD-10-CM | POA: Diagnosis not present

## 2018-04-13 DIAGNOSIS — R7301 Impaired fasting glucose: Secondary | ICD-10-CM | POA: Diagnosis not present

## 2018-04-13 LAB — POCT GLYCOSYLATED HEMOGLOBIN (HGB A1C): HEMOGLOBIN A1C: 6.1

## 2018-04-13 MED ORDER — DUTASTERIDE 0.5 MG PO CAPS: 0.5000 mg | ORAL_CAPSULE | Freq: Every day | ORAL | 1 refills | Status: DC

## 2018-04-13 NOTE — Progress Notes (Signed)
Subjective:    Patient ID: George Young, male    DOB: January 30, 1966, 52 y.o.   MRN: [id number]  HPI Impaired fasting glucose-no increased thirst or urination. No symptoms consistent with hypoglycemia.  BPH-he did receive notification from [employer], his insurance company letting him know that they will not cover finasteride but they will pay for Avodart.  Patient's had allergies for years and is at the point where he would actually like to consider getting as allergy testing.  He said right now his allergies are not severe but he knows that they will increase as we move into the spring and summer.  Review of Systems   BP 134/66   Pulse 66   Ht 5\' 6"  (1.676 m)   Wt 160 lb (72.6 kg)   SpO2 98%   BMI 25.82 kg/m     Allergies  Allergen Reactions  . Simvastatin     REACTION: Myalgias    Past Medical History:  Diagnosis Date  . Hiatal hernia   . Hyperlipemia   . Wrist fracture 2010    Past Surgical History:  Procedure Laterality Date  . ESOPHAGOGASTRODUODENOSCOPY    . HAIR TRANSPLANT     Dr. Denna Haggard  . LASIK  08/2013   Right    Social History   Socioeconomic History  . Marital status: Married    Spouse name: Maralyn Sago  . Number of children: Not on file  . Years of education: Not on file  . Highest education level: Not on file  Occupational History    Comment: [employer]  Social Needs  . Financial resource strain: Not on file  . Food insecurity:    Worry: Not on file    Inability: Not on file  . Transportation needs:    Medical: Not on file    Non-medical: Not on file  Tobacco Use  . Smoking status: Never Smoker  . Smokeless tobacco: Never Used  Substance and Sexual Activity  . Alcohol use: Yes    Alcohol/week: 0.0 oz    Comment: 2 per month  . Drug use: No  . Sexual activity: Yes    Partners: Female  Lifestyle  . Physical activity:    Days per week: Not on file    Minutes per session: Not on file  . Stress: Not on file  Relationships  . Social  connections:    Talks on phone: Not on file    Gets together: Not on file    Attends religious service: Not on file    Active member of club or organization: Not on file    Attends meetings of clubs or organizations: Not on file    Relationship status: Not on file  . Intimate partner violence:    Fear of current or ex partner: Not on file    Emotionally abused: Not on file    Physically abused: Not on file    Forced sexual activity: Not on file  Other Topics Concern  . Not on file  Social History Narrative   Some exercise, usually 3 times per week. occ caffeine but not daily.  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     Family History  Problem Relation Age of Onset  . Hyperlipidemia Mother   . Heart disease Mother        Has pacemaker  . Irregular heart beat Mother        pacemaker  . Hypertension Father     Outpatient Encounter Medications as of 04/13/2018  Medication Sig  . atorvastatin (LIPITOR) 10 MG tablet TAKE 1 TABLET DAILY  . cetirizine (ZYRTEC) 10 MG tablet Take 1 tablet (10 mg total) by mouth daily.  . diclofenac sodium (VOLTAREN) 1 % GEL Apply 2 g topically 4 (four) times daily. To affected joint.  [name]. dutasteride (AVODART) 0.5 MG capsule Take 1 capsule (0.5 mg total) by mouth daily.  . finasteride (PROSCAR) 5 MG tablet TAKE ONE-HALF TABLET BY MOUTH ONCE DAILY  . fluticasone (FLONASE) 50 MCG/ACT nasal spray Place 2 sprays into both nostrils daily.  . ranitidine (ZANTAC) 150 MG capsule Take 1 capsule (150 mg total) by mouth 2 (two) times daily.   No facility-administered encounter medications on file as of 04/13/2018.         Objective:   Physical Exam  Constitutional: He is oriented to person, place, and time. He appears well-developed and well-nourished.  HENT:  Head: Normocephalic and atraumatic.  Cardiovascular: Normal rate, regular rhythm and normal heart sounds.  Pulmonary/Chest: Effort normal and breath sounds normal.  Neurological: He is alert  and oriented to person, place, and time.  Skin: Skin is warm and dry.  Psychiatric: He has a normal mood and affect. His behavior is normal.        Assessment & Plan:  IFG - Well controlled. Continue current regimen. Follow up in  6months.  A1C is 6.1.    BPH - will try Avodart as preferred by his insurance co. sent to mail order.  If he has any problems he can let me know.  Just monitor for lightheadedness or dizziness.  AR - would like allergy testing.  Place referral.  Patient prefers to stay here locally.  He currently works in [employer] so [place] for [place] to [place]  Though he is looking for a new job and is unhappy where he is currently.

## 2018-04-29 ENCOUNTER — Encounter: Payer: Self-pay | Admitting: Family Medicine

## 2018-04-29 MED ORDER — FINASTERIDE 5 MG PO TABS: 5.0000 mg | ORAL_TABLET | Freq: Every day | ORAL | 3 refills | Status: DC

## 2018-10-13 ENCOUNTER — Encounter: Payer: Self-pay | Admitting: Family Medicine

## 2018-10-13 ENCOUNTER — Ambulatory Visit (INDEPENDENT_AMBULATORY_CARE_PROVIDER_SITE_OTHER): Payer: 59 | Admitting: Family Medicine

## 2018-10-13 VITALS — BP 130/74 | HR 75 | Ht 66.0 in | Wt 160.0 lb

## 2018-10-13 DIAGNOSIS — E7800 Pure hypercholesterolemia, unspecified: Secondary | ICD-10-CM | POA: Diagnosis not present

## 2018-10-13 DIAGNOSIS — R972 Elevated prostate specific antigen [PSA]: Secondary | ICD-10-CM

## 2018-10-13 DIAGNOSIS — R7989 Other specified abnormal findings of blood chemistry: Secondary | ICD-10-CM

## 2018-10-13 DIAGNOSIS — R7301 Impaired fasting glucose: Secondary | ICD-10-CM | POA: Diagnosis not present

## 2018-10-13 DIAGNOSIS — N401 Enlarged prostate with lower urinary tract symptoms: Secondary | ICD-10-CM

## 2018-10-13 LAB — POCT GLYCOSYLATED HEMOGLOBIN (HGB A1C): Hemoglobin A1C: 6.1 % — AB (ref 4.0–5.6)

## 2018-10-13 NOTE — Progress Notes (Signed)
Subjective:    CC: IFG  HPI:  Impaired fasting glucose-no increased thirst or urination. No symptoms consistent with hypoglycemia.  Hyperlipidemia  -currently on atorvastatin and tolerating well without any side effects or myalgias.  Elevate PSA-due to repeat yearly PSA.  He has a small bump a little over a year ago and then it seems to trend back down.  Vitamin D deficiency-he has been taking 1000 IU daily of vitamin D over the summer.  Try to get out in the sun a little bit.   Past medical history, Surgical history, Family history not pertinant except as noted below, Social history, Allergies, and medications have been entered into the medical record, reviewed, and corrections made.   Review of Systems: No fevers, chills, night sweats, weight loss, chest pain, or shortness of breath.   Objective:    General: Well Developed, well nourished, and in no acute distress.  Neuro: Alert and oriented x3, extra-ocular muscles intact, sensation grossly intact.  HEENT: Normocephalic, atraumatic  Skin: Warm and dry, no rashes. Cardiac: Regular rate and rhythm, no murmurs rubs or gallops, no lower extremity edema.  Respiratory: Clear to auscultation bilaterally. Not using accessory muscles, speaking in full sentences.   Impression and Recommendations:    IFG - Well controlled. Continue current regimen. Follow up in  6months. Stable from previous. Lab Results  Component Value Date   HGBA1C 6.1 (A) 10/13/2018   Vitamin D deficiency-currently on supplementation.  Due to recheck levels.  Elevated PSA -due to recheck PSA level.  Hyperlipidemia -due to recheck lipid level continue with atorvastatin.

## 2018-10-14 LAB — COMPLETE METABOLIC PANEL WITH GFR
AG Ratio: 1.8 (calc) (ref 1.0–2.5)
ALT: 23 U/L (ref 9–46)
AST: 17 U/L (ref 10–35)
Albumin: 4.5 g/dL (ref 3.6–5.1)
Alkaline phosphatase (APISO): 70 U/L (ref 40–115)
BUN: 15 mg/dL (ref 7–25)
CALCIUM: 9.5 mg/dL (ref 8.6–10.3)
CO2: 29 mmol/L (ref 20–32)
Chloride: 100 mmol/L (ref 98–110)
Creat: 0.93 mg/dL (ref 0.70–1.33)
GFR, EST NON AFRICAN AMERICAN: 95 mL/min/{1.73_m2} (ref >=60)
GFR, Est African American: 110 mL/min/{1.73_m2} (ref >=60)
Globulin: 2.5 g/dL (calc) (ref 1.9–3.7)
Glucose, Bld: 116 mg/dL — ABNORMAL HIGH (ref 65–99)
POTASSIUM: 4.4 mmol/L (ref 3.5–5.3)
Sodium: 140 mmol/L (ref 135–146)
TOTAL PROTEIN: 7 g/dL (ref 6.1–8.1)
Total Bilirubin: 0.5 mg/dL (ref 0.2–1.2)

## 2018-10-14 LAB — CBC
HEMATOCRIT: 45.3 % (ref 38.5–50.0)
Hemoglobin: 15.4 g/dL (ref 13.2–17.1)
MCH: 30.5 pg (ref 27.0–33.0)
MCHC: 34 g/dL (ref 32.0–36.0)
MCV: 89.7 fL (ref 80.0–100.0)
MPV: 11.9 fL (ref 7.5–12.5)
Platelets: 268 10*3/uL (ref 140–400)
RBC: 5.05 10*6/uL (ref 4.20–5.80)
RDW: 13.1 % (ref 11.0–15.0)
WBC: 6.8 10*3/uL (ref 3.8–10.8)

## 2018-10-14 LAB — VITAMIN D 25 HYDROXY (VIT D DEFICIENCY, FRACTURES): VIT D 25 HYDROXY: 28 ng/mL — AB (ref 30–100)

## 2018-10-14 LAB — PSA: PSA: 2.5 ng/mL (ref <=4.0)

## 2018-10-14 LAB — SPECIMEN COMPROMISED

## 2018-10-14 LAB — VITAMIN B12: VITAMIN B 12: 473 pg/mL (ref 200–1100)

## 2019-03-12 ENCOUNTER — Other Ambulatory Visit: Payer: Self-pay | Admitting: Family Medicine

## 2019-03-12 DIAGNOSIS — E7800 Pure hypercholesterolemia, unspecified: Secondary | ICD-10-CM

## 2019-03-19 ENCOUNTER — Other Ambulatory Visit: Payer: Self-pay | Admitting: Family Medicine

## 2019-04-06 ENCOUNTER — Telehealth: Payer: Self-pay | Admitting: Family Medicine

## 2019-04-06 MED ORDER — FINASTERIDE 5 MG PO TABS: 2.5000 mg | ORAL_TABLET | Freq: Every day | ORAL | 3 refills | Status: DC

## 2019-04-06 NOTE — Telephone Encounter (Signed)
PT requested refill on medication. He stated, "Medication for my hair".  Didn't have the named of the medication.  Please Advise.

## 2019-04-06 NOTE — Addendum Note (Signed)
Addended by: Deno Etienne on: 04/06/2019 04:09 PM   Modules accepted: Orders

## 2019-04-06 NOTE — Telephone Encounter (Addendum)
Medication sent. Pt advised.Loralee Pacas [place]

## 2019-04-12 ENCOUNTER — Ambulatory Visit: Payer: 59 | Admitting: Family Medicine

## 2019-05-02 ENCOUNTER — Encounter: Payer: Self-pay | Admitting: Family Medicine

## 2019-05-02 NOTE — Telephone Encounter (Signed)
Left message for patient to schedule appointment.

## 2019-05-03 ENCOUNTER — Encounter: Payer: Self-pay | Admitting: Family Medicine

## 2019-05-03 ENCOUNTER — Ambulatory Visit (INDEPENDENT_AMBULATORY_CARE_PROVIDER_SITE_OTHER): Payer: 59 | Admitting: Family Medicine

## 2019-05-03 VITALS — BP 130/84 | HR 81 | Temp 97.9°F | Wt 161.0 lb

## 2019-05-03 DIAGNOSIS — M25512 Pain in left shoulder: Secondary | ICD-10-CM

## 2019-05-03 NOTE — Progress Notes (Signed)
George Young is a 53 y.o. male who presents to [facility] today for left shoulder pain.  Patient is a 6-week history of left shoulder pain.  He denies any particular injury.  He notes that he had been lifting weights and it is increasing his weight slowly when the pain started.  He notes he is tried treating the pain with rest.  Did COVID-19 he has not been at the gym and not been lifting weights.  Additionally he is tried ibuprofen BenGay and a topical heating pad which have all helped a little.  He notes pain is located in the left lateral upper arm is worse with overhead motion reaching back and with sleep at bedtime.  He denies any radiating pain below the level of the elbow weakness or numbness distally.  No fevers or chills nausea vomiting or diarrhea. .   ROS:  As above  Exam:  BP 130/84   Pulse 81   Temp 97.9 F (36.6 C) (Oral)   Wt 161 lb (73 kg)   BMI 25.99 kg/m  Wt Readings from Last 5 Encounters:  05/03/19 161 lb (73 kg)  10/13/18 160 lb (72.6 kg)  04/13/18 160 lb (72.6 kg)  04/04/18 163 lb (73.9 kg)  10/13/17 157 lb (71.2 kg)   General: Well Developed, well nourished, and in no acute distress.  Neuro/Psych: Alert and oriented x3, extra-ocular muscles intact, able to move all 4 extremities, sensation grossly intact. Skin: Warm and dry, no rashes noted.  Respiratory: Not using accessory muscles, speaking in full sentences, trachea midline.  Cardiovascular: Pulses palpable, no extremity edema. Abdomen: Does not appear distended. MSK:  C-spine: Nontender to spinal midline.  Normal spinal motion.  Left shoulder: Normal-appearing Nontender. Range of motion: abduction limited to about 160 degrees External rotation limited to 50 degrees beyond the neutral position Internal rotation limited to lumbar spine Positive Hawkins and Neer's test. Negative crossover arm compression test. Negative Yergason's and speeds test. Negative  clunk and relocation test. Intact strength.  Positive empty can test.  Right shoulder: Normal-appearing nontender normal range of motion negative impingement testing normal strength.   Lab and Radiology Results Procedure: Real-time Ultrasound Guided Injection of left subacromial bursa Device: GE Logiq E   Images permanently stored and available for review in the ultrasound unit. Verbal informed consent obtained.  Discussed risks and benefits of procedure. Warned about infection bleeding damage to structures skin hypopigmentation and fat atrophy among others. Patient expresses understanding and agreement Time-out conducted.   Noted no overlying erythema, induration, or other signs of local infection.   Skin prepped in a sterile fashion.   Local anesthesia: Topical Ethyl chloride.   With sterile technique and under real time ultrasound guidance:  40 milligrams of Kenalog and 2 mL of Marcaine injected easily.   Completed without difficulty   Pain immediately resolved suggesting accurate placement of the medication.   Advised to call if fevers/chills, erythema, induration, drainage, or persistent bleeding.   Images permanently stored and available for review in the ultrasound unit.  Impression: Technically successful ultrasound guided injection.         Assessment and Plan: 53 y.o. male with left shoulder pain likely due to subacromial bursitis or impingement.  Plan for injection and home exercise.  Additionally will use diclofenac gel topically. If not improving next step would be x-ray physical therapy and potentially proceeding with MRI.  Home physical therapy activities reviewed.  Recheck in about a month especially if  not improving.   Meds ordered this encounter  Medications  . diclofenac sodium (VOLTAREN) 1 % GEL    Sig: Apply 2 g topically 4 (four) times daily. To affected joint.    Dispense:  100 g    Refill:  11    Historical information moved to improve visibility of  documentation.  Past Medical History:  Diagnosis Date  . Hiatal hernia   . Hyperlipemia   . Wrist fracture 2010   Past Surgical History:  Procedure Laterality Date  . ESOPHAGOGASTRODUODENOSCOPY    . HAIR TRANSPLANT     Dr. Denna HaggardAngela Phipps  . LASIK  08/2013   Right   Social History   Tobacco Use  . Smoking status: Never Smoker  . Smokeless tobacco: Never Used  Substance Use Topics  . Alcohol use: Yes    Alcohol/week: 0.0 standard drinks    Comment: 2 per month   family history includes Heart disease in his mother; Hyperlipidemia in his mother; Hypertension in his father; Irregular heart beat in his mother.  Medications: Current Outpatient Medications  Medication Sig Dispense Refill  . atorvastatin (LIPITOR) 10 MG tablet TAKE 1 TABLET DAILY 90 tablet 3  . cetirizine (ZYRTEC) 10 MG tablet Take 1 tablet (10 mg total) by mouth daily. 365 tablet 0  . cholecalciferol (VITAMIN D) 1000 units tablet Take 5,000 Units by mouth daily.     . finasteride (PROSCAR) 5 MG tablet Take 0.5 tablets (2.5 mg total) by mouth daily. 45 tablet 3  . fluticasone (FLONASE) 50 MCG/ACT nasal spray Place 2 sprays into both nostrils daily. 16 g 6  . diclofenac sodium (VOLTAREN) 1 % GEL Apply 2 g topically 4 (four) times daily. To affected joint. 100 g 11   No current facility-administered medications for this visit.    Allergies  Allergen Reactions  . Simvastatin     REACTION: Myalgias      Discussed warning signs or symptoms. Please see discharge instructions. Patient expresses understanding.

## 2019-05-03 NOTE — Patient Instructions (Addendum)
Thank you for coming in today. Call or go to the ER if you develop a large red swollen joint with extreme pain or oozing puss.   Do the exercises we discussed.  Up to the front,  Up tp to the side Internal rotation External rotation About 30 reps 2x daily  Ok to use dlclofenac gel    Lets try about 4 weeks. If not getting better let me know. We will probably have a video visit and talk about next steps including formal PT, Xray and them MRI.    Shoulder Impingement Syndrome Rehab Ask your health care provider which exercises are safe for you. Do exercises exactly as told by your health care provider and adjust them as directed. It is normal to feel mild stretching, pulling, tightness, or discomfort as you do these exercises, but you should stop right away if you feel sudden pain or your pain gets worse.Do not begin these exercises until told by your health care provider. Stretching and range of motion exercise This exercise warms up your muscles and joints and improves the movement and flexibility of your shoulder. This exercise also helps to relieve pain and stiffness. Exercise A: Passive horizontal adduction  1. Sit or stand and pull your left / right elbow across your chest, toward your other shoulder. Stop when you feel a gentle stretch in the back of your shoulder and upper arm. ? Keep your arm at shoulder height. ? Keep your arm as close to your body as you comfortably can. 2. Hold for __________ seconds. 3. Slowly return to the starting position. Repeat __________ times. Complete this exercise __________ times a day. Strengthening exercises These exercises build strength and endurance in your shoulder. Endurance is the ability to use your muscles for a long time, even after they get tired. Exercise B: External rotation, isometric 1. Stand or sit in a doorway, facing the door frame. 2. Bend your left / right elbow and place the back of your wrist against the door frame. Only  your wrist should be touching the frame. Keep your upper arm at your side. 3. Gently press your wrist against the door frame, as if you are trying to push your arm away from your abdomen. ? Avoid shrugging your shoulder while you press your hand against the door frame. Keep your shoulder blade tucked down toward the middle of your back. 4. Hold for __________ seconds. 5. Slowly release the tension, and relax your muscles completely before you do the exercise again. Repeat __________ times. Complete this exercise __________ times a day. Exercise C: Internal rotation, isometric  1. Stand or sit in a doorway, facing the door frame. 2. Bend your left / right elbow and place the inside of your wrist against the door frame. Only your wrist should be touching the frame. Keep your upper arm at your side. 3. Gently press your wrist against the door frame, as if you are trying to push your arm toward your abdomen. ? Avoid shrugging your shoulder while you press your hand against the door frame. Keep your shoulder blade tucked down toward the middle of your back. 4. Hold for __________ seconds. 5. Slowly release the tension, and relax your muscles completely before you do the exercise again. Repeat __________ times. Complete this exercise __________ times a day. Exercise D: Scapular protraction, supine  1. Lie on your back on a firm surface. Hold a __________ weight in your left / right hand. 2. Raise your left / right arm  straight into the air so your hand is directly above your shoulder joint. 3. Push the weight into the air so your shoulder lifts off of the surface that you are lying on. Do not move your head, neck, or back. 4. Hold for __________ seconds. 5. Slowly return to the starting position. Let your muscles relax completely before you repeat this exercise. Repeat __________ times. Complete this exercise __________ times a day. Exercise E: Scapular retraction  1. Sit in a stable chair without  armrests, or stand. 2. Secure an exercise band to a stable object in front of you so the band is at shoulder height. 3. Hold one end of the exercise band in each hand. Your palms should face down. 4. Squeeze your shoulder blades together and move your elbows slightly behind you. Do not shrug your shoulders while you do this. 5. Hold for __________ seconds. 6. Slowly return to the starting position. Repeat __________ times. Complete this exercise __________ times a day. Exercise F: Shoulder extension  1. Sit in a stable chair without armrests, or stand. 2. Secure an exercise band to a stable object in front of you where the band is above shoulder height. 3. Hold one end of the exercise band in each hand. 4. Straighten your elbows and lift your hands up to shoulder height. 5. Squeeze your shoulder blades together and pull your hands down to the sides of your thighs. Stop when your hands are straight down by your sides. Do not let your hands go behind your body. 6. Hold for __________ seconds. 7. Slowly return to the starting position. Repeat __________ times. Complete this exercise __________ times a day. This information is not intended to replace advice given to you by your health care provider. Make sure you discuss any questions you have with your health care provider. Document Released: 12/14/2005 Document Revised: 08/20/2016 Document Reviewed: 11/16/2015 Elsevier Interactive Patient Education  2019 [employer]

## 2019-05-04 ENCOUNTER — Encounter: Payer: Self-pay | Admitting: Family Medicine

## 2019-05-04 MED ORDER — DICLOFENAC SODIUM 1 % TD GEL: 2.0000 g | Freq: Four times a day (QID) | TRANSDERMAL | 11 refills | Status: DC

## 2019-05-31 ENCOUNTER — Telehealth: Payer: 59 | Admitting: Family Medicine

## 2019-06-15 ENCOUNTER — Encounter: Payer: Self-pay | Admitting: Family Medicine

## 2019-07-07 ENCOUNTER — Ambulatory Visit (INDEPENDENT_AMBULATORY_CARE_PROVIDER_SITE_OTHER): Payer: 59

## 2019-07-07 ENCOUNTER — Other Ambulatory Visit: Payer: Self-pay

## 2019-07-07 ENCOUNTER — Telehealth (INDEPENDENT_AMBULATORY_CARE_PROVIDER_SITE_OTHER): Payer: 59 | Admitting: Family Medicine

## 2019-07-07 ENCOUNTER — Encounter: Payer: Self-pay | Admitting: Family Medicine

## 2019-07-07 VITALS — BP 145/80 | HR 104 | Temp 98.4°F | Ht 66.0 in | Wt 160.0 lb

## 2019-07-07 DIAGNOSIS — M25512 Pain in left shoulder: Secondary | ICD-10-CM | POA: Diagnosis not present

## 2019-07-07 NOTE — Patient Instructions (Signed)
Thank you for coming in today. Get xray now and attend PT.  I will send you a question message in 4 weeks to prompt MRI if not better.

## 2019-07-07 NOTE — Progress Notes (Signed)
George Young is a 53 y.o. male who presents to [facility] today for  left shoulder pain.  Patient was seen on May 6 for shoulder pain.  At that time he had been ongoing for 6 weeks.  He tried some at home conservative management including BenGay heat and stretching and exercise.  During the visit he was thought to have subacromial impingement or rotator cuff tendinitis.  He was treated with a subacromial bursa injection at work quite well immediately.  Additionally was prescribed diclofenac gel and home exercise was reviewed.  In the interim he notes that his shoulder pain is continued.  He notes the injection worked immediately following the injection but only lasted for a few weeks.  He notes continued bothersome shoulder pain that is limiting his ability to exercise.  He continues dedicated home exercise program but notes it is not getting much better.    ROS:  As above  Exam:  BP (!) 145/80   Pulse (!) 104   Temp 98.4 F (36.9 C) (Oral)   Ht 5\' 6"  (1.676 m)   Wt 160 lb (72.6 kg)   BMI 25.82 kg/m  Wt Readings from Last 5 Encounters:  07/07/19 160 lb (72.6 kg)  05/03/19 161 lb (73 kg)  10/13/18 160 lb (72.6 kg)  04/13/18 160 lb (72.6 kg)  04/04/18 163 lb (73.9 kg)   General: Well Developed, well nourished, and in no acute distress.  Neuro/Psych: Alert and oriented x3, extra-ocular muscles intact, able to move all 4 extremities, sensation grossly intact. Skin: Warm and dry, no rashes noted.  Respiratory: Not using accessory muscles, speaking in full sentences, trachea midline.  Cardiovascular: Pulses palpable, no extremity edema. Abdomen: Does not appear distended. MSK: Left shoulder: Normal-appearing. Normal range of motion however pain with abduction beyond 100 degrees. Strength slightly diminished abduction 4+/5 with pain. Strength 4+/5 to external rotation.  Normal internal rotation strength. Positive Hawkins and Neer's test.   Positive empty can test.  Negative Yergason's and speeds test.  Contralateral right shoulder normal-appearing nontender normal motion normal strength negative impingement testing.  Pulses cap refill and sensation are intact distally.    Lab and Radiology Results X-ray images left shoulder personally independently reviewed: No acute fractures or deformity.  Mild AC DJD.  No significant glenohumeral DJD. Wait formal radiology over read     Assessment and Plan: 53 y.o. male with left shoulder pain.  Failing to improve with conservative management.  We discussed options.  Patient elects to continue further conservative options.  Plan to proceed with referral to physical therapy.  We will check back via MyChart message in 4 weeks.  If not better at that point MRI for repeat injection or surgical planning would be warranted.  Precautions reviewed.   PDMP not reviewed this encounter. Orders Placed This Encounter  Procedures  . DG Shoulder Left    Standing Status:   Future    Number of Occurrences:   1    Standing Expiration Date:   09/06/2020    Order Specific Question:   Reason for Exam (SYMPTOM  OR DIAGNOSIS REQUIRED)    Answer:   eval left shoulder pain and impingement    Order Specific Question:   Preferred imaging location?    Answer[facility]   [facility]    Order Specific Question:   Radiology Contrast Protocol - do NOT remove file path    Answer:   [email or web address]  . Ambulatory referral to Physical Therapy  Referral Priority:   Routine    Referral Type:   Physical Medicine    Referral Reason:   Specialty Services Required    Requested Specialty:   Physical Therapy   No orders of the defined types were placed in this encounter.   Historical information moved to improve visibility of documentation.  Past Medical History:  Diagnosis Date  . Hiatal hernia   . Hyperlipemia   . Wrist fracture 2010   Past Surgical History:   Procedure Laterality Date  . ESOPHAGOGASTRODUODENOSCOPY    . HAIR TRANSPLANT     Dr. Eual Fines  . LASIK  08/2013   Right   Social History   Tobacco Use  . Smoking status: Never Smoker  . Smokeless tobacco: Never Used  Substance Use Topics  . Alcohol use: Yes    Alcohol/week: 0.0 standard drinks    Comment: 2 per month   family history includes Heart disease in his mother; Hyperlipidemia in his mother; Hypertension in his father; Irregular heart beat in his mother.  Medications: Current Outpatient Medications  Medication Sig Dispense Refill  . atorvastatin (LIPITOR) 10 MG tablet TAKE 1 TABLET DAILY 90 tablet 3  . cetirizine (ZYRTEC) 10 MG tablet Take 1 tablet (10 mg total) by mouth daily. 365 tablet 0  . cholecalciferol (VITAMIN D) 1000 units tablet Take 5,000 Units by mouth daily.     . diclofenac sodium (VOLTAREN) 1 % GEL Apply 2 g topically 4 (four) times daily. To affected joint. 100 g 11  . finasteride (PROSCAR) 5 MG tablet Take 0.5 tablets (2.5 mg total) by mouth daily. 45 tablet 3  . fluticasone (FLONASE) 50 MCG/ACT nasal spray Place 2 sprays into both nostrils daily. 16 g 6   No current facility-administered medications for this visit.    Allergies  Allergen Reactions  . Simvastatin     REACTION: Myalgias      Discussed warning signs or symptoms. Please see discharge instructions. Patient expresses understanding.

## 2019-07-13 ENCOUNTER — Ambulatory Visit (INDEPENDENT_AMBULATORY_CARE_PROVIDER_SITE_OTHER): Payer: 59 | Admitting: Physical Therapy

## 2019-07-13 ENCOUNTER — Other Ambulatory Visit: Payer: Self-pay

## 2019-07-13 ENCOUNTER — Encounter: Payer: Self-pay | Admitting: Physical Therapy

## 2019-07-13 DIAGNOSIS — M25612 Stiffness of left shoulder, not elsewhere classified: Secondary | ICD-10-CM | POA: Diagnosis not present

## 2019-07-13 DIAGNOSIS — R293 Abnormal posture: Secondary | ICD-10-CM | POA: Diagnosis not present

## 2019-07-13 DIAGNOSIS — M25512 Pain in left shoulder: Secondary | ICD-10-CM | POA: Diagnosis not present

## 2019-07-13 NOTE — Telephone Encounter (Signed)
Patient called today wanting to know if we have his immunizations entered so he can come pick his records back up.   Do you know anything about this?

## 2019-07-13 NOTE — Therapy (Signed)
[facility] [address] [place], [place], [address] Phone: [phone]   Fax:  [phone]  Physical Therapy Evaluation  Patient Details  Name: George Young MRN: [id number] Date of Birth: [birth year] Referring Provider (PT): Rodolph Bongorey, Evan S, MD   Encounter Date: 07/13/2019  PT End of Session - 07/13/19 1522    Visit Number  1    Number of Visits  6    Date for PT Re-Evaluation  08/24/19    PT Start Time  1432    PT Stop Time  1515    PT Time Calculation (min)  43 min    Activity Tolerance  Patient tolerated treatment well    Behavior During Therapy  [facility] for tasks assessed/performed       Past Medical History:  Diagnosis Date  . Hiatal hernia   . Hyperlipemia   . Wrist fracture 2010    Past Surgical History:  Procedure Laterality Date  . ESOPHAGOGASTRODUODENOSCOPY    . HAIR TRANSPLANT     Dr. Denna HaggardAngela Phipps  . LASIK  08/2013   Right    There were no vitals filed for this visit.   Subjective Assessment - 07/13/19 1435    Subjective  Pt is a 53 y/o male who presents to OPPT for Lt shoulder pain x 3-4 months.  Pt reports he was going to they gym and noticed some mild pain, which progressed to difficulty to ADLs.  Pt had injection in May 2020 which provided ~ 2-3 days of relief.  Pt reports rest after fitness centers closed didn't help the pain.    Limitations  Lifting;Other (comment)   sleeping   Diagnostic tests  U/S: inflammation    Patient Stated Goals  improve pain and mobility    Currently in Pain?  Yes    Pain Score  0-No pain   up to 7/10   Pain Location  Shoulder    Pain Orientation  Left    Pain Descriptors / Indicators  Shooting    Pain Type  Chronic pain;Acute pain    Pain Frequency  Intermittent    Aggravating Factors   abduction, reaching behind back    Pain Relieving Factors  medication - minimally helpful         [facility] - 07/13/19 1442      Assessment   Medical Diagnosis   M25.512 (ICD-10-CM) - Acute pain of left shoulder    Referring Provider (PT)  Rodolph Bongorey, Evan S, MD    Onset Date/Surgical Date  --   March 2020   Hand Dominance  Right    Next MD Visit  PRN    Prior Therapy  n/a      Precautions   Precautions  None      Restrictions   Weight Bearing Restrictions  No      Balance Screen   Has the patient fallen in the past 6 months  No    Has the patient had a decrease in activity level because of a fear of falling?   No    Is the patient reluctant to leave their home because of a fear of falling?   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    Leisure  go to gym (5 days/wk pre COVID), drums, music      Cognition   Overall Cognitive Status  Within Functional Limits for tasks assessed      Observation/Other Assessments   Focus on Therapeutic Outcomes (FOTO)   57 (43% limited; predicted 30% limited)      Posture/Postural Control   Posture/Postural Control  Postural limitations    Postural Limitations  Rounded Shoulders;Forward head      ROM / Strength   AROM / PROM / Strength  AROM;Strength;PROM      AROM   Overall AROM Comments  FIR to L1 on Lt; T8 on Rt; Lt shouler other motions WNL with mild pain    AROM Assessment Site  Shoulder    Right/Left Shoulder  Left    Left Shoulder ABduction  133 Degrees      PROM   PROM Assessment Site  Shoulder    Right/Left Shoulder  Left    Left Shoulder ABduction  137 Degrees      Strength   Strength Assessment Site  Shoulder;Elbow    Right/Left Shoulder  Right;Left    Right Shoulder Flexion  5/5    Right Shoulder ABduction  4+/5    Right Shoulder Internal Rotation  5/5    Right Shoulder  External Rotation  5/5    Left Shoulder Flexion  5/5    Left Shoulder ABduction  3+/5    Left Shoulder Internal Rotation  5/5    Left Shoulder External Rotation  5/5    Right/Left Elbow  Right;Left    Right Elbow Flexion  5/5    Right Elbow Extension  5/5    Left Elbow Flexion  5/5    Left Elbow Extension  5/5      Palpation   Palpation comment  trigger points in Lt middle/anterior deltoid      Special Tests    Special Tests  Rotator Cuff Impingement    Rotator Cuff Impingment tests  Michel Bickers test      Hawkins-Kennedy test   Findings  Negative                Objective measurements completed on examination: See above findings.      [place] Adult PT Treatment/Exercise - 07/13/19 1442      Exercises   Exercises  Shoulder      Shoulder Exercises: Seated   Retraction  Both;10 reps    External Rotation  Both;10 reps      Shoulder Exercises: Stretch   Other Shoulder Stretches  mid and high doorway stretch x30 sec       Manual Therapy   Manual Therapy  Soft tissue mobilization    Manual therapy comments  skilled palpation and monitoring of soft tissue during DN    Soft tissue mobilization  Rt anterior/middle deltoid       Trigger Point Dry Needling - 07/13/19 1521    Consent Given?  Yes    Education Handout Provided  Yes    Muscles Treated Upper Quadrant  Deltoid    Dry Needling Comments  Lt shoulder abduction improved to = to Rt after DN    Deltoid Response  Twitch response elicited;Palpable increased muscle length           PT Education - 07/13/19 1522    Education Details  HEP, DN    Person(s)  Educated  Patient    Methods  Explanation;Demonstration;Handout    Comprehension  Verbalized understanding;Returned demonstration;Need further instruction          PT Long Term Goals - 07/13/19 1525      PT LONG TERM GOAL #1   Title  independent with HEP    Status  New    Target Date  08/24/19      PT LONG TERM GOAL #2   Title  FOTO score  improved to </= 30% limited for improved function    Status  New    Target Date  08/24/19      PT LONG TERM GOAL #3   Title  demonstrate Lt shoulder functional internal rotation to at least T10 without increase in pain for improved function    Status  New    Target Date  08/24/19      PT LONG TERM GOAL #4   Title  Lt shoulder abduction to WNL without pain for improved function    Status  New    Target Date  08/24/19      PT LONG TERM GOAL #5   Title  report 50% improvement in sleep for improved function    Status  New    Target Date  08/24/19             Plan - 07/13/19 1523    Clinical Impression Statement  Pt is a 53 y/o male who presents to OPPT for Lt shoulder pain x 4 months.  Pt unsure of injury, but feels may be associated to gym exercise, but pain persisted following closure of fitness centers and active rest.  Pt demonstrates decreased strength and ROM, with active trigger points and poor postural awareness.  Pt will benefit from PT to address deficits listed.    Examination-Activity Limitations  Dressing;Lift;Reach Overhead;Carry    Examination-Participation Restrictions  Yard Work    Stability/Clinical Decision Making  Stable/Uncomplicated    Clinical Decision Making  Low    Rehab Potential  Good    PT Frequency  1x / week    PT Duration  6 weeks    PT Treatment/Interventions  ADLs/Self Care Home Management;Cryotherapy;Electrical Stimulation;Iontophoresis 4mg /ml Dexamethasone;Moist Heat;Therapeutic exercise;Therapeutic activities;Ultrasound;Patient/family education;Manual techniques;Taping;Vasopneumatic Device;Dry needling;Passive range of motion    PT Next Visit Plan  review HEP, progress to add gentle strengthening as able, assess response to DN and repeat PRN    PT Home Exercise Plan  Access Code: [email or web address]    Consulted and Agree with Plan of Care  Patient       Patient will benefit from skilled therapeutic intervention in order to improve the following  deficits and impairments:  Increased fascial restricitons, Increased muscle spasms, Decreased strength, Decreased range of motion, Impaired flexibility, Postural dysfunction, Pain, Impaired UE functional use  Visit Diagnosis: 1. Acute pain of left shoulder   2. Stiffness of left shoulder, not elsewhere classified   3. Abnormal posture        Problem List Patient Active Problem List   Diagnosis Date Noted  . Right knee pain 11/03/2013  . Ganglion cyst 11/03/2013  . Hiatal hernia 11/11/2012  . FATIGUE 08/08/2010  . PSA, INCREASED 08/08/2010  . SHOULDER PAIN, RIGHT 09/28/2008  . IFG (impaired fasting glucose) 09/26/2007  . HYPERCHOLESTEROLEMIA, PURE 07/12/2007  . ABNORMAL FINDINGS, ELEVATED BP W/O HTN 07/12/2007       Clarita CraneStephanie F Elie Leppo, PT, DPT 07/13/19 3:34 PM      [facility] [phone]  [address] [place], [place], [address] Phone: [phone]   Fax:  [phone]  Name: George DenseFernando Mcandrew MRN: [id number] Date of Birth: 1966-05-15

## 2019-07-13 NOTE — Patient Instructions (Signed)
Access Code: [email or web address]  URL: [email or web address]  Date: 07/13/2019  Prepared by: Faustino Congress   Exercises  Doorway Pec Stretch at 90 Degrees Abduction - 3 reps - 1 sets - 30 sec hold - 1x daily - 7x weekly  Doorway Pec Stretch at 120 Degrees Abduction - 3 reps - 1 sets - 30 sec hold - 1x daily - 7x weekly  Seated Scapular Retraction - 10 reps - 1 sets - 5 sec hold - 2x daily - 7x weekly  Shoulder External Rotation and Scapular Retraction - 10 reps - 1 sets - 1-2 sec hold - 1x daily - 7x weekly  Patient Education  Trigger Point Dry Needling

## 2019-07-17 ENCOUNTER — Ambulatory Visit (INDEPENDENT_AMBULATORY_CARE_PROVIDER_SITE_OTHER): Payer: 59 | Admitting: Physical Therapy

## 2019-07-17 ENCOUNTER — Encounter: Payer: Self-pay | Admitting: Physical Therapy

## 2019-07-17 ENCOUNTER — Other Ambulatory Visit: Payer: Self-pay

## 2019-07-17 DIAGNOSIS — M25612 Stiffness of left shoulder, not elsewhere classified: Secondary | ICD-10-CM

## 2019-07-17 DIAGNOSIS — M25512 Pain in left shoulder: Secondary | ICD-10-CM | POA: Diagnosis not present

## 2019-07-17 DIAGNOSIS — R293 Abnormal posture: Secondary | ICD-10-CM | POA: Diagnosis not present

## 2019-07-17 NOTE — Therapy (Signed)
[facility] [address] [place], [place], [address] Phone: [phone]   Fax:  [phone]  Physical Therapy Treatment  Patient Details  Name: George Young MRN: [id number] Date of Birth: [birth year] Referring Provider (PT): Rodolph Bongorey, Evan S, MD   Encounter Date: 07/17/2019  PT End of Session - 07/17/19 0741    Visit Number  2    Number of Visits  6    Date for PT Re-Evaluation  08/24/19    PT Start Time  0735    PT Stop Time  0816    PT Time Calculation (min)  41 min    Activity Tolerance  Patient tolerated treatment well    Behavior During Therapy  [facility] for tasks assessed/performed       Past Medical History:  Diagnosis Date  . Hiatal hernia   . Hyperlipemia   . Wrist fracture 2010    Past Surgical History:  Procedure Laterality Date  . ESOPHAGOGASTRODUODENOSCOPY    . HAIR TRANSPLANT     Dr. Denna HaggardAngela Phipps  . LASIK  08/2013   Right    There were no vitals filed for this visit.  Subjective Assessment - 07/17/19 0739    Subjective  Pt states improvement of ROM after last visit. He still has mild pain, increaesd with abd, lifting, elevation.    Limitations  Lifting;House hold activities    Patient Stated Goals  improve pain and mobility    Currently in Pain?  Yes    Pain Score  5     Pain Location  Shoulder    Pain Orientation  Left    Pain Descriptors / Indicators  Shooting    Pain Type  Acute pain    Pain Frequency  Intermittent                       [facility] Adult PT Treatment/Exercise - 07/17/19 0741      Posture/Postural Control   Posture/Postural Control  Postural limitations    Postural Limitations  Rounded Shoulders;Forward head      Exercises   Exercises  Shoulder      Shoulder Exercises: Supine   External Rotation  AAROM;15 reps    External Rotation Limitations  cane at 45 deg    Flexion  AAROM;15 reps    Flexion Limitations  cane      Shoulder Exercises: Seated   Retraction  --    External Rotation  --      Shoulder Exercises: Standing   External Rotation  AROM;Strengthening;10 reps    External Rotation Limitations  x10 no band// x10 RTB    Row  Strengthening;20 reps    Theraband Level (Shoulder Row)  Level 2 (Red)      Shoulder Exercises: Pulleys   Flexion  2 minutes    ABduction  2 minutes      Shoulder Exercises: Stretch   Other Shoulder Stretches  45 and 90 deg doorway stretch 2 x30 sec     Other Shoulder Stretches  Pec stretch arm off table 30 sec x3 on L;       Manual Therapy   Manual Therapy  Soft tissue mobilization;Joint mobilization;Passive ROM    Manual therapy comments  --    Joint Mobilization  Post and Inf gr 3    Soft tissue mobilization  DTM/IASTM to L pec, anterior shoulder and deltoid.     Passive ROM  L shoulder, all motions  PT Education - 07/17/19 0758    Education Details  HEP updated    Person(s) Educated  Patient    Methods  Explanation;Demonstration;Tactile cues    Comprehension  Verbalized understanding;Returned demonstration;Tactile cues required;Verbal cues required          PT Long Term Goals - 07/13/19 1525      PT LONG TERM GOAL #1   Title  independent with HEP    Status  New    Target Date  08/24/19      PT LONG TERM GOAL #2   Title  FOTO score improved to </= 30% limited for improved function    Status  New    Target Date  08/24/19      PT LONG TERM GOAL #3   Title  demonstrate Lt shoulder functional internal rotation to at least T10 without increase in pain for improved function    Status  New    Target Date  08/24/19      PT LONG TERM GOAL #4   Title  Lt shoulder abduction to WNL without pain for improved function    Status  New    Target Date  08/24/19      PT LONG TERM GOAL #5   Title  report 50% improvement in sleep for improved function    Status  New    Target Date  08/24/19            Plan - 07/17/19 0820    Clinical Impression Statement  Pt with  stiffness with flexion and ER (increased stiffness at higher elevation). Also with tightness in pecs and anterior shoulder. Focus on this with ther ex and manual during session today. HEP updated to reinforce. Pt with mild ROM improvments, but soreness with elevation. Discussed posture and work posture. Plan to progress as tolerated.    Examination-Activity Limitations  Dressing;Lift;Reach Overhead;Carry    Examination-Participation Restrictions  Yard Work    Stability/Clinical Decision Making  Stable/Uncomplicated    Rehab Potential  Good    PT Frequency  1x / week    PT Duration  6 weeks    PT Treatment/Interventions  ADLs/Self Care Home Management;Cryotherapy;Electrical Stimulation;Iontophoresis 4mg /ml Dexamethasone;Moist Heat;Therapeutic exercise;Therapeutic activities;Ultrasound;Patient/family education;Manual techniques;Taping;Vasopneumatic Device;Dry needling;Passive range of motion    PT Next Visit Plan  review HEP, progress to add gentle strengthening as able, assess response to DN and repeat PRN    PT Home Exercise Plan  Access Code: [email or web address]    Consulted and Agree with Plan of Care  Patient       Patient will benefit from skilled therapeutic intervention in order to improve the following deficits and impairments:  Increased fascial restricitons, Increased muscle spasms, Decreased strength, Decreased range of motion, Impaired flexibility, Postural dysfunction, Pain, Impaired UE functional use  Visit Diagnosis: 1. Acute pain of left shoulder   2. Stiffness of left shoulder, not elsewhere classified   3. Abnormal posture        Problem List Patient Active Problem List   Diagnosis Date Noted  . Right knee pain 11/03/2013  . Ganglion cyst 11/03/2013  . Hiatal hernia 11/11/2012  . FATIGUE 08/08/2010  . PSA, INCREASED 08/08/2010  . SHOULDER PAIN, RIGHT 09/28/2008  . IFG (impaired fasting glucose) 09/26/2007  . HYPERCHOLESTEROLEMIA, PURE 07/12/2007  . ABNORMAL FINDINGS,  ELEVATED BP W/O HTN 07/12/2007   Sedalia MutaLauren Rogan Ecklund, PT, DPT 8:26 AM  07/17/19    [facility] [address] [place], [place], [address] Phone:  [phone]   Fax:  [phone]  Name: George Young MRN: [id number] Date of Birth: [birth year]

## 2019-07-17 NOTE — Patient Instructions (Addendum)
Access Code: [email or web address]  URL: [email or web address]  Date: 07/17/2019  Prepared by: Lyndee Hensen     Exercises Doorway Pec Stretch at 90 Degrees Abduction - 3 reps - 1 sets - 30 sec hold - 1x daily - 7x weekly Doorway Pec Stretch at 120 Degrees Abduction - 3 reps - 1 sets - 30 sec hold - 1x daily - 7x weekly Seated Scapular Retraction - 10 reps - 1 sets - 5 sec hold - 2x daily - 7x weekly Shoulder External Rotation and Scapular Retraction - 10 reps - 1 sets - 1-2 sec hold - 1x daily - 7x weekly Supine Shoulder Flexion with Dowel - 10 reps - 1 sets - 5 hold - 2x daily Supine Shoulder External Rotation with Dowel - 10 reps - 1 sets - 5 hold - 2x daily Supine Chest Stretch on Foam Roll - 3 reps - 30 hold - 2x daily Patient Education Trigger Point Dry Needling

## 2019-07-27 ENCOUNTER — Ambulatory Visit (INDEPENDENT_AMBULATORY_CARE_PROVIDER_SITE_OTHER): Payer: 59 | Admitting: Physical Therapy

## 2019-07-27 ENCOUNTER — Other Ambulatory Visit: Payer: Self-pay

## 2019-07-27 ENCOUNTER — Encounter: Payer: Self-pay | Admitting: Physical Therapy

## 2019-07-27 DIAGNOSIS — M25612 Stiffness of left shoulder, not elsewhere classified: Secondary | ICD-10-CM

## 2019-07-27 DIAGNOSIS — M25512 Pain in left shoulder: Secondary | ICD-10-CM

## 2019-07-27 DIAGNOSIS — R293 Abnormal posture: Secondary | ICD-10-CM | POA: Diagnosis not present

## 2019-07-27 NOTE — Therapy (Signed)
[place] [address] [place], [place], [address] Phone: [phone]   Fax:  [phone]  Physical Therapy Treatment  Patient Details  Name: George Young MRN: [id number] Date of Birth: Sep 28, 1966 Referring Provider (PT): Gregor Hams, MD   Encounter Date: 07/27/2019  PT End of Session - 07/27/19 1617    Visit Number  3    Number of Visits  6    Date for PT Re-Evaluation  08/24/19    PT Start Time  1528    PT Stop Time  1610    PT Time Calculation (min)  42 min    Activity Tolerance  Patient tolerated treatment well    Behavior During Therapy  [facility] for tasks assessed/performed       Past Medical History:  Diagnosis Date  . Hiatal hernia   . Hyperlipemia   . Wrist fracture 2010    Past Surgical History:  Procedure Laterality Date  . ESOPHAGOGASTRODUODENOSCOPY    . HAIR TRANSPLANT     Dr. Eual Fines  . LASIK  08/2013   Right    There were no vitals filed for this visit.  Subjective Assessment - 07/27/19 1531    Subjective  feels like motion is better; still stiff in the mornings.  better overall    Limitations  Lifting;House hold activities    Patient Stated Goals  improve pain and mobility    Currently in Pain?  Yes    Pain Score  5     Pain Location  Shoulder    Pain Orientation  Left    Pain Descriptors / Indicators  Shooting    Pain Type  Acute pain    Pain Frequency  Intermittent    Aggravating Factors   abduction, reaching behind back                       [facility] Adult PT Treatment/Exercise - 07/27/19 1532      Shoulder Exercises: Standing   External Rotation  Strengthening;15 reps;Theraband    Theraband Level (Shoulder External Rotation)  Level 3 (Green)    External Rotation Limitations  standing with noodle behind back    Row  15 reps;Theraband   5 sec hold   Theraband Level (Shoulder Row)  Level 3 (Green)      Shoulder Exercises: Pulleys   Flexion  2 minutes    ABduction  2 minutes      Shoulder Exercises: Stretch   Other Shoulder Stretches  low/mid/high doorway stretch 2x30 sec      Manual Therapy   Manual Therapy  Joint mobilization;Soft tissue mobilization    Manual therapy comments  skilled palpation and monitoring of soft tissue during DN    Joint Mobilization  Post and Inf gr 3    Soft tissue mobilization  DTM/STM to L pec, anterior shoulder and deltoid.        Trigger Point Dry Needling - 07/27/19 1616    Consent Given?  Yes    Education Handout Provided  Previously provided    Muscles Treated Upper Quadrant  Deltoid   anterior   Electrical Stimulation Performed with Dry Needling  Yes    E-stim with Dry Needling Details  40mHz freq with intensity to tolerance    Deltoid Response  Twitch response elicited;Palpable increased muscle length                PT Long Term Goals - 07/13/19 1525  PT LONG TERM GOAL #1   Title  independent with HEP    Status  New    Target Date  08/24/19      PT LONG TERM GOAL #2   Title  FOTO score improved to </= 30% limited for improved function    Status  New    Target Date  08/24/19      PT LONG TERM GOAL #3   Title  demonstrate Lt shoulder functional internal rotation to at least T10 without increase in pain for improved function    Status  New    Target Date  08/24/19      PT LONG TERM GOAL #4   Title  Lt shoulder abduction to WNL without pain for improved function    Status  New    Target Date  08/24/19      PT LONG TERM GOAL #5   Title  report 50% improvement in sleep for improved function    Status  New    Target Date  08/24/19            Plan - 07/27/19 1617    Clinical Impression Statement  Pt tolerated session well with improvements in ROM and reports some decrease in pain.  Pain is still present, but pt able to increase activity.  Pt overall doing well with positive response to PT to date.  Concerned about copay so additional visits may be limited.     Examination-Activity Limitations  Dressing;Lift;Reach Overhead;Carry    Examination-Participation Restrictions  Yard Work    Stability/Clinical Decision Making  Stable/Uncomplicated    Rehab Potential  Good    PT Frequency  1x / week    PT Duration  6 weeks    PT Treatment/Interventions  ADLs/Self Care Home Management;Cryotherapy;Electrical Stimulation;Iontophoresis 4mg /ml Dexamethasone;Moist Heat;Therapeutic exercise;Therapeutic activities;Ultrasound;Patient/family education;Manual techniques;Taping;Vasopneumatic Device;Dry needling;Passive range of motion    PT Next Visit Plan  review HEP, progress to add gentle strengthening as able, assess response to DN and repeat PRN    PT Home Exercise Plan  Access Code: [email or web address]    Consulted and Agree with Plan of Care  Patient       Patient will benefit from skilled therapeutic intervention in order to improve the following deficits and impairments:  Increased fascial restricitons, Increased muscle spasms, Decreased strength, Decreased range of motion, Impaired flexibility, Postural dysfunction, Pain, Impaired UE functional use  Visit Diagnosis: 1. Acute pain of left shoulder   2. Stiffness of left shoulder, not elsewhere classified   3. Abnormal posture        Problem List Patient Active Problem List   Diagnosis Date Noted  . Right knee pain 11/03/2013  . Ganglion cyst 11/03/2013  . Hiatal hernia 11/11/2012  . FATIGUE 08/08/2010  . PSA, INCREASED 08/08/2010  . SHOULDER PAIN, RIGHT 09/28/2008  . IFG (impaired fasting glucose) 09/26/2007  . HYPERCHOLESTEROLEMIA, PURE 07/12/2007  . ABNORMAL FINDINGS, ELEVATED BP W/O HTN 07/12/2007      Clarita CraneStephanie F Jeanluc Wegman, PT, DPT 07/27/19 4:19 PM    [facility] [address] [place], [place], [address] Phone: [phone]   Fax:  [phone]  Name: George Young MRN: [id number] Date of Birth: 1966-04-24

## 2019-08-10 ENCOUNTER — Encounter: Payer: 59 | Admitting: Physical Therapy

## 2019-08-24 ENCOUNTER — Encounter: Payer: Self-pay | Admitting: Physical Therapy

## 2019-08-24 ENCOUNTER — Other Ambulatory Visit: Payer: Self-pay

## 2019-08-24 ENCOUNTER — Ambulatory Visit (INDEPENDENT_AMBULATORY_CARE_PROVIDER_SITE_OTHER): Payer: 59 | Admitting: Physical Therapy

## 2019-08-24 DIAGNOSIS — M25612 Stiffness of left shoulder, not elsewhere classified: Secondary | ICD-10-CM

## 2019-08-24 DIAGNOSIS — R293 Abnormal posture: Secondary | ICD-10-CM

## 2019-08-24 DIAGNOSIS — M25512 Pain in left shoulder: Secondary | ICD-10-CM

## 2019-08-24 NOTE — Therapy (Addendum)
[place], [place], [address] Phone: [phone]   Fax:  [phone]  Physical Therapy Treatment/Discharge Summary  Patient Details  Name: George Young MRN: [id number] Date of Birth: [birth year] Referring Provider (PT): Gregor Hams, MD   Encounter Date: 08/24/2019  PT End of Session - 08/24/19 1654    Visit Number  4    Number of Visits  6    Date for PT Re-Evaluation  09/14/19   extended due to missed weeks   PT Start Time  1610    PT Stop Time  1642    PT Time Calculation (min)  32 min    Activity Tolerance  Patient tolerated treatment well    Behavior During Therapy  [facility] for tasks assessed/performed       Past Medical History:  Diagnosis Date  . Hiatal hernia   . Hyperlipemia   . Wrist fracture 2010    Past Surgical History:  Procedure Laterality Date  . ESOPHAGOGASTRODUODENOSCOPY    . HAIR TRANSPLANT     Dr. Eual Fines  . LASIK  08/2013   Right    There were no vitals filed for this visit.  Subjective Assessment - 08/24/19 1613    Subjective  pain feels inconsistent; states sometimes he's fine the other times he has sharp pains.    Limitations  Lifting;House hold activities    Patient Stated Goals  improve pain and mobility    Currently in Pain?  No/denies                       [facility] Adult PT Treatment/Exercise - 08/24/19 1615      Shoulder Exercises: Pulleys   Flexion  2 minutes    ABduction  2 minutes      Manual Therapy   Manual Therapy  Soft tissue mobilization    Manual therapy comments  skilled palpation and monitoring of soft tissue during DN    Soft tissue mobilization  DTM/STM to L pec, anterior shoulder and deltoid.        Trigger Point Dry Needling - 08/24/19 1653    Consent Given?  Yes    Education Handout Provided  Previously provided    Muscles Treated Upper Quadrant  Deltoid   anterior and posterior   Electrical Stimulation  Performed with Dry Needling  Yes    E-stim with Dry Needling Details  16mz freq with intensity to tolerance    Deltoid Response  Twitch response elicited;Palpable increased muscle length                PT Long Term Goals - 08/24/19 1654      PT LONG TERM GOAL #1   Title  independent with HEP    Status  On-going    Target Date  09/14/19      PT LONG TERM GOAL #2   Title  FOTO score improved to </= 30% limited for improved function    Status  On-going    Target Date  09/14/19      PT LONG TERM GOAL #3   Title  demonstrate Lt shoulder functional internal rotation to at least T10 without increase in pain for improved function    Status  On-going    Target Date  09/14/19      PT LONG TERM GOAL #4   Title  Lt shoulder abduction to WNL without pain for improved function    Status  On-going  Target Date  09/14/19      PT LONG TERM GOAL #5   Title  report 50% improvement in sleep for improved function    Status  On-going    Target Date  09/14/19            Plan - 08/24/19 1655    Clinical Impression Statement  Pt reports improved pain following DN, but carryover at this time remains limited.  Pt compliant with HEP, and requests follow up in 3 weeks due to insurance and financial responsibility.    Examination-Activity Limitations  Dressing;Lift;Reach Overhead;Carry    Examination-Participation Restrictions  Yard Work    Stability/Clinical Decision Making  Stable/Uncomplicated    Rehab Potential  Good    PT Frequency  1x / week    PT Duration  6 weeks    PT Treatment/Interventions  ADLs/Self Care Home Management;Cryotherapy;Electrical Stimulation;Iontophoresis 90m/ml Dexamethasone;Moist Heat;Therapeutic exercise;Therapeutic activities;Ultrasound;Patient/family education;Manual techniques;Taping;Vasopneumatic Device;Dry needling;Passive range of motion    PT Next Visit Plan  review HEP, progress to add gentle strengthening as able, assess response to DN and repeat  PRN    PT Home Exercise Plan  Access Code: [email or web address]   Consulted and Agree with Plan of Care  Patient       Patient will benefit from skilled therapeutic intervention in order to improve the following deficits and impairments:  Increased fascial restricitons, Increased muscle spasms, Decreased strength, Decreased range of motion, Impaired flexibility, Postural dysfunction, Pain, Impaired UE functional use  Visit Diagnosis: Stiffness of left shoulder, not elsewhere classified  Acute pain of left shoulder  Abnormal posture     Problem List Patient Active Problem List   Diagnosis Date Noted  . Right knee pain 11/03/2013  . Ganglion cyst 11/03/2013  . Hiatal hernia 11/11/2012  . FATIGUE 08/08/2010  . PSA, INCREASED 08/08/2010  . SHOULDER PAIN, RIGHT 09/28/2008  . IFG (impaired fasting glucose) 09/26/2007  . HYPERCHOLESTEROLEMIA, PURE 07/12/2007  . ABNORMAL FINDINGS, ELEVATED BP W/O HTN 07/12/2007      SLaureen Abrahams PT, DPT 08/24/19 4:58 PM     [facility] [address] [place] [place] [address]Phone: [phone]  Fax:  [phone] Name: George BramMRN: [id number] of Birth: 3Mar 29, 1967    PHYSICAL THERAPY DISCHARGE SUMMARY  Visits from Start of Care: 4  Current functional level related to goals / functional outcomes: See above   Remaining deficits: See above   Education / Equipment: HEP  Plan: Patient agrees to discharge.  Patient goals were not met. Patient is being discharged due to not returning since the last visit.  ?????     SLaureen Abrahams PT, DPT 10/23/19 11:32 AM  [facility] [address] [id number] [phone](office) [phone](fax)

## 2019-08-29 ENCOUNTER — Telehealth: Payer: Self-pay | Admitting: Family Medicine

## 2019-08-29 NOTE — Telephone Encounter (Signed)
Error

## 2019-09-03 LAB — HEPATIC FUNCTION PANEL
ALT: 29 (ref 10–40)
AST: 20 (ref 14–40)
Alkaline Phosphatase: 76 (ref 25–125)
Bilirubin, Total: 0.4

## 2019-09-03 LAB — LIPID PANEL
Cholesterol: 194 (ref 0–200)
HDL: 41 (ref 35–70)
LDL Cholesterol: 109

## 2019-09-03 LAB — CBC AND DIFFERENTIAL
HCT: 45 (ref 41–53)
Platelets: 277 (ref 150–399)

## 2019-09-03 LAB — BASIC METABOLIC PANEL
BUN: 11 (ref 4–21)
Creatinine: 0.9 (ref 0.6–1.3)
Potassium: 4.3 (ref 3.4–5.3)
Sodium: 142 (ref 137–147)

## 2019-09-03 LAB — HEPATITIS B SURFACE ANTIGEN: Hepatitis B Surface Ag: <0.1

## 2019-09-05 ENCOUNTER — Encounter: Payer: Self-pay | Admitting: Family Medicine

## 2019-09-05 DIAGNOSIS — M25512 Pain in left shoulder: Secondary | ICD-10-CM

## 2019-09-08 LAB — LIPID PANEL
Cholesterol: 196 (ref 0–200)
HDL: 45 (ref 35–70)
LDL Cholesterol: 128
Triglycerides: 126 (ref 40–160)

## 2019-09-08 LAB — BASIC METABOLIC PANEL: Glucose: 119

## 2019-09-08 LAB — HEMOGLOBIN A1C: Hemoglobin A1C: 6

## 2019-09-14 ENCOUNTER — Encounter: Payer: Self-pay | Admitting: Physical Therapy

## 2019-09-15 ENCOUNTER — Encounter: Payer: Self-pay | Admitting: Family Medicine

## 2019-09-28 ENCOUNTER — Other Ambulatory Visit: Payer: Self-pay

## 2019-09-28 ENCOUNTER — Encounter: Payer: Self-pay | Admitting: Family Medicine

## 2019-09-28 ENCOUNTER — Ambulatory Visit (INDEPENDENT_AMBULATORY_CARE_PROVIDER_SITE_OTHER): Payer: 59 | Admitting: Family Medicine

## 2019-09-28 VITALS — BP 130/85 | HR 70 | Wt 160.0 lb

## 2019-09-28 DIAGNOSIS — M25512 Pain in left shoulder: Secondary | ICD-10-CM

## 2019-09-28 DIAGNOSIS — M7552 Bursitis of left shoulder: Secondary | ICD-10-CM | POA: Diagnosis not present

## 2019-09-28 DIAGNOSIS — M7711 Lateral epicondylitis, right elbow: Secondary | ICD-10-CM

## 2019-09-28 DIAGNOSIS — M7522 Bicipital tendinitis, left shoulder: Secondary | ICD-10-CM

## 2019-09-28 NOTE — Patient Instructions (Signed)
Thank you for coming in today. You should hear from [place] about appointment with Dr Griffin Basil for consult shoulder.  Let me know if you do not hear anything.  Let me know if he is not in network.  Could also use Dr Tamera Punt or Dr Onnie Graham or even Dr Ninfa Linden or Dr Marlou Sa.   For elbow.  I think this is tennis elbow.  Do the elbow straight hand down stretch as much you can Do the theraband flexbar (remember to keep the elbow straight) Use diclofnac gel.  Let me know if not better.

## 2019-09-28 NOTE — Progress Notes (Signed)
George Young is a 53 y.o. male who presents to [place] today for follow-up left shoulder pain.  Patient has been seen several times in 2020 for left shoulder pain.  He had trials of conservative management including physical therapy and home exercise program.  Additionally he had subacromial bursa injection in May which provided relief only for a few weeks.  He had trial of physical therapy from July to August with not sufficient benefit and proceeded with outpatient noncontrast MRI at [facility] on September 30.  He is here today for follow-up and evaluation.  MRI showed no significant rotator cuff tear however did show moderate tendinosis of long head biceps tendon above groove as well as thickening and edema inferior glenohumeral ligament somewhat concerning for adhesive capsulitis as well as mild AC DJD and changes with subacromial bursitis.  He continues to experience shoulder pain but not significant lack of range of motion.  With normal activity his pain is mostly mild however when he tries to start weightlifting or do over head activity the pain worsens and prevents him from doing activity that he desires.  Additionally he notes right elbow pain.  He has been working from home recently and has developed pain at the right lateral elbow.  Pain is worse with wrist motion.  He thinks his work set up at home is not ideal which may be causing his pain.  He has not tried much treatment for this yet.  Symptoms ongoing for a few weeks now.    ROS:  As above  Exam:  BP 130/85   Pulse 70   Wt 160 lb (72.6 kg)   BMI 25.82 kg/m  Wt Readings from Last 5 Encounters:  09/28/19 160 lb (72.6 kg)  07/07/19 160 lb (72.6 kg)  05/03/19 161 lb (73 kg)  10/13/18 160 lb (72.6 kg)  04/13/18 160 lb (72.6 kg)   General: Well Developed, well nourished, and in no acute distress.  Neuro/Psych: Alert and oriented x3, extra-ocular muscles intact, able to move all  4 extremities, sensation grossly intact. Skin: Warm and dry, no rashes noted.  Respiratory: Not using accessory muscles, speaking in full sentences, trachea midline.  Cardiovascular: Pulses palpable, no extremity edema. Abdomen: Does not appear distended. MSK:  Left shoulder: Normal-appearing nontender.  Normal motion.  Intact strength.  Positive Hawkins and Neer's test.  Negative positive can test.  Right elbow: Normal-appearing tender palpation lateral epicondyle. Pain with wrist extension.  Intact elbow strength.  Normal elbow range of motion.      Lab and Radiology Results  MR SHOULDER LEFT WO CONTRAST9/30/2020 [facility] Result Impression    1. No full-thickness rotator cuff tear or atrophy. 2. Moderate tendinosis of the long head biceps above the groove. 3. Mild edema and thickening of the inferior glenohumeral ligament which can be seen with adhesive capsulitis in the appropriate clinical setting. 4. Mild acromioclavicular joint degenerative changes with subacromial subdeltoid bursitis.  Result Narrative  MRI LEFT SHOULDER WITHOUT CONTRAST, 09/26/2019 4:48 PM  INDICATION: \ M25.512 Acute pain of left shoulder  COMPARISON: None.  TECHNIQUE: Multi-planar, multi-sequence imaging of the left shoulder was performed without IV or intra-articular contrast.  FINDINGS:  . Acromioclavicular joint: Mild degenerative changes. . Acromion: Subacromial subdeltoid bursitis. Subacromial spur. . Supraspinatus tendon: Moderate tendinosis. . Infraspinatus tendon: Mild tendinosis. . Subscapularis tendon: Mild tendinosis. . Teres minor tendon: Intact. . Long head of biceps: Moderate tendinosis above the groove . Glenohumeral joint:  Small joint effusion with synovitis. Mild glenohumeral chondrosis. . Labrum: Degenerative labral fraying. . Bones: Normal marrow signal. No fracture, neoplasm, or avascular necrosis. . Additional comments: Mild edema  and thickening of the inferior glenohumeral ligament.      Assessment and Plan: 53 y.o. male with  Left shoulder pain: Ongoing for months now and not improving sufficiently despite adequate conservative management including dedicated trial of physical therapy and home exercise program, subacromial injection.  MRI shows biceps tendinitis, subacromial bursitis and inferior glenohumeral ligament edema. Do not think patient has adhesive capsulitis as he has good range of motion.  At this point he may benefit from repeat injection but I am not optimistic about that.  I think it is reasonable to have consultation with orthopedic surgery to discuss his surgical options.  He may benefit from subacromial decompression, distal clavicle excision and biceps tenodesis.  Right elbow pain is lateral epicondylitis.  Reviewed home exercise program including eccentric exercises and stretching.  Also discussed proper placement of counterforce brace.  Recommend diclofenac gel as well.  Recheck as needed.  I spent 25 minutes with this patient, greater than 50% was face-to-face time counseling regarding differential diagnosis surgical plan and next steps.[name]  PDMP not reviewed this encounter. Orders Placed This Encounter  Procedures  . Ambulatory referral to Orthopedic Surgery    Referral Priority:   Routine    Referral Type:   Surgical    Referral Reason:   Specialty Services Required    Referred to Provider:   Bjorn Pippin, MD    Requested Specialty:   Orthopedic Surgery    Number of Visits Requested:   1   No orders of the defined types were placed in this encounter.   Historical information moved to improve visibility of documentation.  Past Medical History:  Diagnosis Date  . Hiatal hernia   . Hyperlipemia   . Wrist fracture 2010   Past Surgical History:  Procedure Laterality Date  . ESOPHAGOGASTRODUODENOSCOPY    . HAIR TRANSPLANT     Dr. Denna Haggard  . LASIK  08/2013   Right   Social  History   Tobacco Use  . Smoking status: Never Smoker  . Smokeless tobacco: Never Used  Substance Use Topics  . Alcohol use: Yes    Alcohol/week: 0.0 standard drinks    Comment: 2 per month   family history includes Heart disease in his mother; Hyperlipidemia in his mother; Hypertension in his father; Irregular heart beat in his mother.  Medications: Current Outpatient Medications  Medication Sig Dispense Refill  . atorvastatin (LIPITOR) 10 MG tablet TAKE 1 TABLET DAILY 90 tablet 3  . cetirizine (ZYRTEC) 10 MG tablet Take 1 tablet (10 mg total) by mouth daily. 365 tablet 0  . cholecalciferol (VITAMIN D) 1000 units tablet Take 5,000 Units by mouth daily.     . diclofenac sodium (VOLTAREN) 1 % GEL Apply 2 g topically 4 (four) times daily. To affected joint. 100 g 11  . finasteride (PROSCAR) 5 MG tablet Take 0.5 tablets (2.5 mg total) by mouth daily. 45 tablet 3  . fluticasone (FLONASE) 50 MCG/ACT nasal spray Place 2 sprays into both nostrils daily. 16 g 6   No current facility-administered medications for this visit.    Allergies  Allergen Reactions  . Simvastatin     REACTION: Myalgias      Discussed warning signs or symptoms. Please see discharge instructions. Patient expresses understanding.

## 2019-09-29 DIAGNOSIS — M7522 Bicipital tendinitis, left shoulder: Secondary | ICD-10-CM | POA: Insufficient documentation

## 2019-09-29 DIAGNOSIS — M7552 Bursitis of left shoulder: Secondary | ICD-10-CM | POA: Insufficient documentation

## 2019-11-08 ENCOUNTER — Other Ambulatory Visit: Payer: Self-pay

## 2019-11-08 ENCOUNTER — Ambulatory Visit (INDEPENDENT_AMBULATORY_CARE_PROVIDER_SITE_OTHER): Payer: 59 | Admitting: Rehabilitative and Restorative Service Providers"

## 2019-11-08 DIAGNOSIS — R29898 Other symptoms and signs involving the musculoskeletal system: Secondary | ICD-10-CM | POA: Diagnosis not present

## 2019-11-08 DIAGNOSIS — M25612 Stiffness of left shoulder, not elsewhere classified: Secondary | ICD-10-CM

## 2019-11-08 DIAGNOSIS — M25512 Pain in left shoulder: Secondary | ICD-10-CM

## 2019-11-08 DIAGNOSIS — R600 Localized edema: Secondary | ICD-10-CM

## 2019-11-08 DIAGNOSIS — R293 Abnormal posture: Secondary | ICD-10-CM | POA: Diagnosis not present

## 2019-11-08 DIAGNOSIS — M6281 Muscle weakness (generalized): Secondary | ICD-10-CM

## 2019-11-08 NOTE — Patient Instructions (Signed)
Access Code: [email or web address]  URL: [email or web address]  Date: 11/08/2019  Prepared by: Rudell Cobb   Exercises Flexion-Extension Shoulder Pendulum with Table Support - 10 reps - 1 sets - 3x daily - 7x weekly Seated Wrist Extension AROM - 10 reps - 1 sets - 2x daily - 7x weekly

## 2019-11-09 NOTE — Therapy (Signed)
[place], [place], [address] Phone: [phone]   Fax:  [phone]  Physical Therapy Evaluation  Patient Details  Name: George Young MRN: [id number] Date of Birth: [birth year] Referring Provider (PT): Hiram Gash, MD   Encounter Date: 11/08/2019  PT End of Session - 11/08/19 1636    Visit Number  1    Number of Visits  24    Date for PT Re-Evaluation  02/06/20    Authorization Type  aetna    Authorization - Number of Visits  66    PT Start Time  0804    PT Stop Time  0845    PT Time Calculation (min)  41 min    Activity Tolerance  Patient tolerated treatment well    Behavior During Therapy  [facility] for tasks assessed/performed       Past Medical History:  Diagnosis Date  . Hiatal hernia   . Hyperlipemia   . Wrist fracture 2010    Past Surgical History:  Procedure Laterality Date  . ESOPHAGOGASTRODUODENOSCOPY    . HAIR TRANSPLANT     Dr. Eual Fines  . LASIK  08/2013   Right    There were no vitals filed for this visit.   Subjective Assessment - 11/08/19 0802    Subjective  The patient is s/p shoulder surgery 10/23/2019.  He is s/p biceps tenodesis, acromioplasty and distal clavicle resection.    Pertinent History  has R arm tennis elbow, hypercholesterolemia, elevated sugar (A1C was 6 last time)    Patient Stated Goals  Be able to use the left arm as well as prior function.    Currently in Pain?  Yes    Pain Score  0-No pain   none at rest   Pain Location  Shoulder    Pain Orientation  Left    Pain Descriptors / Indicators  Aching    Pain Type  Surgical pain    Pain Onset  1 to 4 weeks ago    Pain Frequency  Intermittent    Aggravating Factors   notes if he moves certain ways, he gets discomfort    Pain Relieving Factors  stopped using ice at home, stopped meds         [facility] PT Assessment - 11/09/19 0001      Observation/Other Assessments   Focus on Therapeutic  Outcomes (FOTO)   4% (limited due to post surgical precautions) 96% limited.                Objective measurements completed on examination: See above findings.      [facility] Adult PT Treatment/Exercise - 11/08/19 0828      Exercises   Exercises  Shoulder;Elbow      Shoulder Exercises: ROM/Strengthening   Pendulum  Standing anterior/posterior pendulum    Other ROM/Strengthening Exercises  wrist flexion/extension             PT Education - 11/08/19 1635    Education Details  HEP initiated    [employer]) Educated  Patient    Methods  Explanation;Demonstration;Handout    Comprehension  Returned demonstration;Verbalized understanding       PT Short Term Goals - 11/08/19 1639      PT SHORT TERM GOAL #1   Title  The patient will be indep with HEP for pendulum, wrist flexion/extension, AAROM when indicated.    Time  6    Period  Weeks    Target Date  12/23/19  PT SHORT TERM GOAL #2   Title  The patient will improve AROM to full elbow AROM.    Time  6    Period  Weeks    Target Date  12/23/19      PT SHORT TERM GOAL #3   Title  The patient will have AROM L shoulder to 150 degrees flexion, 90 degrees abduction, 30 degrees ER to demonstrate improving AROM.    Baseline  PROM only performed today.    Time  6    Period  Weeks    Target Date  12/23/19        PT Long Term Goals - 11/09/19 1334      PT LONG TERM GOAL #1   Title  The patient will be indep with progression of HEP.    Time  12    Period  Weeks    Target Date  01/31/20      PT LONG TERM GOAL #2   Title  Improve FOTO to < or equal to 50% limited to demo improved function.    Time  12    Period  Weeks    Target Date  01/31/20      PT LONG TERM GOAL #3   Title  The patient will demonstrate functional AROM ER and IR L shoulder for improved IADL performance.    Time  12    Period  Weeks    Target Date  01/31/20      PT LONG TERM GOAL #4   Title  The patient will perform reaching across his  body without pain to demo improved functional use of the L UE.    Time  12    Period  Weeks    Target Date  01/31/20             Plan - 11/09/19 1338    Clinical Impression Statement  The patient is a 53 yo male s/p L shoulder biceps tenodesis, DCE, SAD with partial acomioplasty, bursectomy presenting to OP therapy with limited ROM, pain, post surgical tenderness leading to dec'd functional use of hte L UE.  PT to address deficits to optimize post surgical gains and improve functional use of the L UE.    Examination-Activity Limitations  Reach Overhead;Lift;Sleep    Stability/Clinical Decision Making  Stable/Uncomplicated    Clinical Decision Making  Low    Rehab Potential  Good    PT Frequency  3x / week   begin at 2x/week until further progression of protocol   PT Duration  12 weeks    PT Treatment/Interventions  ADLs/Self Care Home Management;Cryotherapy;Electrical Stimulation;Ultrasound;Moist Heat;Therapeutic activities;Therapeutic exercise;Patient/family education;Neuromuscular re-education;Manual techniques;Vasopneumatic Device;Passive range of motion    PT Next Visit Plan  check HEP, progress per patient protocol to tolerance    PT Home Exercise Plan  Access Code: [email or web address]    Consulted and Agree with Plan of Care  Patient       Patient will benefit from skilled therapeutic intervention in order to improve the following deficits and impairments:  Pain, Increased edema, Decreased range of motion, Increased fascial restricitons, Impaired UE functional use, Impaired flexibility, Improper body mechanics, Postural dysfunction  Visit Diagnosis: Stiffness of left shoulder, not elsewhere classified  Acute pain of left shoulder  Abnormal posture  Other symptoms and signs involving the musculoskeletal system  Muscle weakness (generalized)     Problem List Patient Active Problem List   Diagnosis Date Noted  . Tendinitis of long head of biceps brachii of  left shoulder  09/29/2019  . Subacromial bursitis of left shoulder joint 09/29/2019  . Ganglion cyst 11/03/2013  . Hiatal hernia 11/11/2012  . FATIGUE 08/08/2010  . PSA, INCREASED 08/08/2010  . IFG (impaired fasting glucose) 09/26/2007  . HYPERCHOLESTEROLEMIA, PURE 07/12/2007  . ABNORMAL FINDINGS, ELEVATED BP W/O HTN 07/12/2007    Allice Garro 11/09/2019, 1:43 PM  [facility] [address] [place] [address] [place], [place], [id number] Phone: [phone]   Fax:  [phone]  Name: George Young MRN: [id number] Date of Birth: November 15, 1966

## 2019-11-14 ENCOUNTER — Ambulatory Visit (INDEPENDENT_AMBULATORY_CARE_PROVIDER_SITE_OTHER): Payer: 59 | Admitting: Physical Therapy

## 2019-11-14 ENCOUNTER — Encounter: Payer: Self-pay | Admitting: Physical Therapy

## 2019-11-14 ENCOUNTER — Other Ambulatory Visit: Payer: Self-pay

## 2019-11-14 DIAGNOSIS — R600 Localized edema: Secondary | ICD-10-CM | POA: Diagnosis not present

## 2019-11-14 DIAGNOSIS — R293 Abnormal posture: Secondary | ICD-10-CM

## 2019-11-14 DIAGNOSIS — M25512 Pain in left shoulder: Secondary | ICD-10-CM | POA: Diagnosis not present

## 2019-11-14 DIAGNOSIS — M25612 Stiffness of left shoulder, not elsewhere classified: Secondary | ICD-10-CM | POA: Diagnosis not present

## 2019-11-14 NOTE — Patient Instructions (Addendum)
Access Code: [email or web address]  URL: [email or web address]  Date: 11/14/2019  Prepared by: Kerin Perna   Exercises  Added: Seated Scapular Retraction - 10 reps - 1 sets - 5-10 seconds hold - 2x daily - 7x weekly  Standing Backward Shoulder Rolls - 10 reps - 1 sets - 2x daily - 7x weekly  Seated Wrist Radial Ulnar Deviation AROM - 10 reps - 1 sets - 2x daily - 7x weekly

## 2019-11-14 NOTE — Therapy (Signed)
[place], [place], [address] Phone: [phone]   Fax:  [phone]  Physical Therapy Treatment  Patient Details  Name: George Young MRN: [id number] Date of Birth: 09-12-1966 Referring Provider (PT): Hiram Gash, MD   Encounter Date: 11/14/2019  PT End of Session - 11/14/19 0803    Visit Number  2    Number of Visits  24    Date for PT Re-Evaluation  02/06/20    Authorization Type  aetna    Authorization - Number of Visits  28    PT Start Time  0803    PT Stop Time  5361    PT Time Calculation (min)  44 min    Activity Tolerance  Patient tolerated treatment well    Behavior During Therapy  [facility] for tasks assessed/performed       Past Medical History:  Diagnosis Date  . Hiatal hernia   . Hyperlipemia   . Wrist fracture 2010    Past Surgical History:  Procedure Laterality Date  . ESOPHAGOGASTRODUODENOSCOPY    . HAIR TRANSPLANT     Dr. Eual Fines  . LASIK  08/2013   Right    There were no vitals filed for this visit.  Subjective Assessment - 11/14/19 0804    Subjective  He states that he has kept up with his home exercise program. Otherwise no new changes.    Currently in Pain?  Yes    Pain Score  4     Pain Location  Shoulder    Pain Orientation  Left    Pain Descriptors / Indicators  Aching    Pain Type  Surgical pain    Aggravating Factors   certain movements cause pain         [facility] PT Assessment - 11/14/19 0001      Assessment   Medical Diagnosis  M67.819 (ICD-10-CM) - Biceps tendonosis of shoulder; Z98.890 (ICD-10-CM) - History of bursectomy    Referring Provider (PT)  Hiram Gash, MD    Onset Date/Surgical Date  10/23/19    Hand Dominance  Right    Next MD Visit  11/20/19    Prior Therapy  had prior shoulder therapy before surgery        [facility] Adult PT Treatment/Exercise - 11/14/19 0001      Exercises   Exercises  Wrist      Shoulder Exercises:  Standing   Retraction  AROM;10 reps   5 sec hold with pool noodle   Other Standing Exercises  shoulder rolls each direction 10 reps    Other Standing Exercises  Pendulums front/back 15 reps      Wrist Exercises   Wrist Flexion  AROM;Left;5 reps   5 sec hold; seated with Lt UE supported   Wrist Extension  AROM;Left;5 reps   5 sec hold; seated with Lt UE supported   Wrist Radial Deviation  AROM;Left;5 reps   5 sec hold; seated with Lt UE supported   Other wrist exercises  grip strength med level x 10 reps   sitting with Lt UE supported     Modalities   Modalities  Vasopneumatic      Vasopneumatic   Number Minutes Vasopneumatic   10 minutes    Vasopnuematic Location   Shoulder    Vasopneumatic Pressure  Low    Vasopneumatic Temperature   34 degrees      Manual Therapy   Manual Therapy  Passive ROM  Passive ROM  Lt shoulder into flexion, extension, scaption, external rotation, internal rotation              PT Education - 11/14/19 0819    Education Details  HEP    Person(s) Educated  Patient    Methods  Explanation;Demonstration;Handout;Verbal cues    Comprehension  Returned demonstration;Verbalized understanding       PT Short Term Goals - 11/08/19 1639      PT SHORT TERM GOAL #1   Title  The patient will be indep with HEP for pendulum, wrist flexion/extension, AAROM when indicated.    Time  6    Period  Weeks    Target Date  12/23/19      PT SHORT TERM GOAL #2   Title  The patient will improve AROM to full elbow AROM.    Time  6    Period  Weeks    Target Date  12/23/19      PT SHORT TERM GOAL #3   Title  The patient will have AROM L shoulder to 150 degrees flexion, 90 degrees abduction, 30 degrees ER to demonstrate improving AROM.    Baseline  PROM only performed today.    Time  6    Period  Weeks    Target Date  12/23/19        PT Long Term Goals - 11/09/19 1334      PT LONG TERM GOAL #1   Title  The patient will be indep with progression of  HEP.    Time  12    Period  Weeks    Target Date  01/31/20      PT LONG TERM GOAL #2   Title  Improve FOTO to < or equal to 50% limited to demo improved function.    Time  12    Period  Weeks    Target Date  01/31/20      PT LONG TERM GOAL #3   Title  The patient will demonstrate functional AROM ER and IR L shoulder for improved IADL performance.    Time  12    Period  Weeks    Target Date  01/31/20      PT LONG TERM GOAL #4   Title  The patient will perform reaching across his body without pain to demo improved functional use of the L UE.    Time  12    Period  Weeks    Target Date  01/31/20            Plan - 11/14/19 0859    Clinical Impression Statement  Pt was guarded with PROM and required frequent cues to relax. Pt had no reports of pain with PROM exercises; performed within tissue limits and no pain. Pt is making progress toward all goals.    Examination-Activity Limitations  Reach Overhead;Lift;Sleep    Stability/Clinical Decision Making  Stable/Uncomplicated    Rehab Potential  Good    PT Frequency  3x / week   begin at 2x/week until further progression of protocol   PT Duration  12 weeks    PT Treatment/Interventions  ADLs/Self Care Home Management;Cryotherapy;Electrical Stimulation;Ultrasound;Moist Heat;Therapeutic activities;Therapeutic exercise;Patient/family education;Neuromuscular re-education;Manual techniques;Vasopneumatic Device;Passive range of motion    PT Next Visit Plan  progress per patient protocol to tolerance    PT Home Exercise Plan  Access Code: [email or web address]    Consulted and Agree with Plan of Care  Patient       Patient will benefit  from skilled therapeutic intervention in order to improve the following deficits and impairments:  Pain, Increased edema, Decreased range of motion, Increased fascial restricitons, Impaired UE functional use, Impaired flexibility, Improper body mechanics, Postural dysfunction  Visit Diagnosis: Stiffness of left  shoulder, not elsewhere classified  Acute pain of left shoulder  Abnormal posture  Localized edema     Problem List Patient Active Problem List   Diagnosis Date Noted  . Tendinitis of long head of biceps brachii of left shoulder 09/29/2019  . Subacromial bursitis of left shoulder joint 09/29/2019  . Ganglion cyst 11/03/2013  . Hiatal hernia 11/11/2012  . FATIGUE 08/08/2010  . PSA, INCREASED 08/08/2010  . IFG (impaired fasting glucose) 09/26/2007  . HYPERCHOLESTEROLEMIA, PURE 07/12/2007  . ABNORMAL FINDINGS, ELEVATED BP W/O HTN 07/12/2007    Olive Bass, SPTA 11/14/2019, 11:03 AM   Mayer Camel, PTA 11/14/19 11:03 AM   [facility] [place] [address] [place], [place], [address] Phone: [phone]   Fax:  [phone]  Name: George Young MRN: [id number] Date of Birth: December 26, 1966

## 2019-11-17 ENCOUNTER — Encounter: Payer: Self-pay | Admitting: Physical Therapy

## 2019-11-17 ENCOUNTER — Other Ambulatory Visit: Payer: Self-pay

## 2019-11-17 ENCOUNTER — Ambulatory Visit (INDEPENDENT_AMBULATORY_CARE_PROVIDER_SITE_OTHER): Payer: 59 | Admitting: Physical Therapy

## 2019-11-17 DIAGNOSIS — R600 Localized edema: Secondary | ICD-10-CM | POA: Diagnosis not present

## 2019-11-17 DIAGNOSIS — M25612 Stiffness of left shoulder, not elsewhere classified: Secondary | ICD-10-CM | POA: Diagnosis not present

## 2019-11-17 DIAGNOSIS — M25512 Pain in left shoulder: Secondary | ICD-10-CM

## 2019-11-17 DIAGNOSIS — R293 Abnormal posture: Secondary | ICD-10-CM | POA: Diagnosis not present

## 2019-11-17 NOTE — Therapy (Signed)
[facility] [place] [address] [place], [place], [address] Phone: [phone]   Fax:  [phone]  Physical Therapy Treatment  Patient Details  Name: George Young MRN: [id number] Date of Birth: 1966/05/30 Referring Provider (PT): Bjorn Pippin, MD   Encounter Date: 11/17/2019  PT End of Session - 11/17/19 1625    Visit Number  3    Number of Visits  24    Date for PT Re-Evaluation  02/06/20    Authorization Type  aetna    Authorization - Number of Visits  60    PT Start Time  1532    PT Stop Time  1616    PT Time Calculation (min)  44 min    Activity Tolerance  Patient tolerated treatment well    Behavior During Therapy  [facility] for tasks assessed/performed       Past Medical History:  Diagnosis Date  . Hiatal hernia   . Hyperlipemia   . Wrist fracture 2010    Past Surgical History:  Procedure Laterality Date  . ESOPHAGOGASTRODUODENOSCOPY    . HAIR TRANSPLANT     Dr. Denna Haggard  . LASIK  08/2013   Right    There were no vitals filed for this visit.  Subjective Assessment - 11/17/19 1538    Subjective  Pt reports he is still sleeping in sling.  Biggest complaint is tightness.    Patient Stated Goals  Be able to use the left arm as well as prior function.    Currently in Pain?  Yes    Pain Score  2     Pain Location  Shoulder    Pain Orientation  Left    Pain Descriptors / Indicators  Tightness    Aggravating Factors   doing therapy    Pain Relieving Factors  resting, ice         [facility] PT Assessment - 11/17/19 0001      Assessment   Medical Diagnosis  M67.819 (ICD-10-CM) - Biceps tendonosis of shoulder; Z98.890 (ICD-10-CM) - History of bursectomy    Referring Provider (PT)  Bjorn Pippin, MD    Onset Date/Surgical Date  10/23/19    Hand Dominance  Right    Next MD Visit  11/20/19    Prior Therapy  had prior shoulder therapy before surgery      Precautions   Precautions  Shoulder    Required  Braces or Orthoses  Sling      ROM / Strength   AROM / PROM / Strength  AROM;PROM      AROM   AROM Assessment Site  Shoulder    Right/Left Shoulder  Left    Left Shoulder Extension  30 Degrees   AAROM standing with cane     PROM   Left Shoulder Flexion  118 Degrees   AAROM with cane in supine   Left Shoulder External Rotation  18 Degrees   AAROM arm at side in supine   Left Elbow Flexion  135    Left Elbow Extension  -3        [facility] Adult PT Treatment/Exercise - 11/17/19 0001      Shoulder Exercises: Supine   External Rotation  AAROM;Left;10 reps   cane   Flexion  AAROM;Both;10 reps   cane   ABduction  AAROM;Left;5 reps   scaption, with cane     Shoulder Exercises: Seated   External Rotation  AAROM;Both;5 reps   cane     Shoulder  Exercises: Standing   Internal Rotation  Both;10 reps;AAROM   cane behind back   Extension  AAROM;Both;10 reps   cane behind back   Other Standing Exercises  shoulder rolls each direction 10 reps      Shoulder Exercises: Stretch   Table Stretch - Flexion  5 reps;10 seconds    Table Stretch - Abduction  5 reps;10 seconds    Table Stretch - External Rotation  5 reps;10 seconds   very limited; switched to supine     Wrist Exercises   Wrist Flexion  --   reviewed verbally     Vasopneumatic   Number Minutes Vasopneumatic   10 minutes    Vasopnuematic Location   Shoulder   Lt   Vasopneumatic Pressure  Low    Vasopneumatic Temperature   34 deg               PT Short Term Goals - 11/08/19 1639      PT SHORT TERM GOAL #1   Title  The patient will be indep with HEP for pendulum, wrist flexion/extension, AAROM when indicated.    Time  6    Period  Weeks    Target Date  12/23/19      PT SHORT TERM GOAL #2   Title  The patient will improve AROM to full elbow AROM.    Time  6    Period  Weeks    Target Date  12/23/19      PT SHORT TERM GOAL #3   Title  The patient will have AROM L shoulder to 150 degrees flexion, 90  degrees abduction, 30 degrees ER to demonstrate improving AROM.    Baseline  PROM only performed today.    Time  6    Period  Weeks    Target Date  12/23/19        PT Long Term Goals - 11/09/19 1334      PT LONG TERM GOAL #1   Title  The patient will be indep with progression of HEP.    Time  12    Period  Weeks    Target Date  01/31/20      PT LONG TERM GOAL #2   Title  Improve FOTO to < or equal to 50% limited to demo improved function.    Time  12    Period  Weeks    Target Date  01/31/20      PT LONG TERM GOAL #3   Title  The patient will demonstrate functional AROM ER and IR L shoulder for improved IADL performance.    Time  12    Period  Weeks    Target Date  01/31/20      PT LONG TERM GOAL #4   Title  The patient will perform reaching across his body without pain to demo improved functional use of the L UE.    Time  12    Period  Weeks    Target Date  01/31/20            Plan - 11/17/19 1653    Clinical Impression Statement  Pt's 3rd visit to therapy;  added AAROM shoulder exercises without difficulty, range kept within tissue limits and no pain.  Pt making good progress within rehab protocol.    Examination-Activity Limitations  Reach Overhead;Lift;Sleep    Stability/Clinical Decision Making  Stable/Uncomplicated    Rehab Potential  Good    PT Frequency  3x / week  begin at 2x/week until further progression of protocol   PT Duration  12 weeks    PT Treatment/Interventions  ADLs/Self Care Home Management;Cryotherapy;Electrical Stimulation;Ultrasound;Moist Heat;Therapeutic activities;Therapeutic exercise;Patient/family education;Neuromuscular re-education;Manual techniques;Vasopneumatic Device;Passive range of motion    PT Next Visit Plan  continue progressive Lt shoulder ROM per protocol.    PT Home Exercise Plan  Access Code: [email or web address]    Consulted and Agree with Plan of Care  Patient       Patient will benefit from skilled therapeutic intervention in  order to improve the following deficits and impairments:  Pain, Increased edema, Decreased range of motion, Increased fascial restricitons, Impaired UE functional use, Impaired flexibility, Improper body mechanics, Postural dysfunction  Visit Diagnosis: Stiffness of left shoulder, not elsewhere classified  Acute pain of left shoulder  Abnormal posture  Localized edema     Problem List Patient Active Problem List   Diagnosis Date Noted  . Tendinitis of long head of biceps brachii of left shoulder 09/29/2019  . Subacromial bursitis of left shoulder joint 09/29/2019  . Ganglion cyst 11/03/2013  . Hiatal hernia 11/11/2012  . FATIGUE 08/08/2010  . PSA, INCREASED 08/08/2010  . IFG (impaired fasting glucose) 09/26/2007  . HYPERCHOLESTEROLEMIA, PURE 07/12/2007  . ABNORMAL FINDINGS, ELEVATED BP W/O HTN 07/12/2007   Kerin Perna, PTA 11/17/19 5:00 PM  [place], [place], [address] Phone: [phone]   Fax:  [phone]  Name: Chandlor Noecker MRN: [id number] Date of Birth: Jun 21, 1966

## 2019-11-17 NOTE — Patient Instructions (Signed)
Access Code: [email or web address]  URL: [email or web address]  Date: 11/17/2019  Prepared by: Kerin Perna   Exercises  Added: Standing Shoulder Extension with Dowel - 10 reps - 1 sets - 2-5 seconds hold - 2x daily - 7x weekly  Standing Shoulder Internal Rotation AAROM with Dowel - 10 reps - 1 sets - 2x daily - 7x weekly  Seated Shoulder External Rotation AAROM with Cane and Hand in Neutral - 10 reps - 1 sets - 5 hold - 2x daily - 7x weekly  Seated Shoulder Flexion Towel Slide at Table Top - 5-10 reps - 1 sets - 5-10 hold - 2x daily - 7x weekly  Supine Shoulder Flexion Extension AAROM with Dowel - 10 reps - 1 sets - 5-10 hold - 2x daily - 7x weekly  Seated Shoulder Scaption Slide at Table Top with Forearm in Neutral - 5-10 reps - 1 sets - 5-10 hold - 2x daily - 7x weekly

## 2019-11-20 ENCOUNTER — Encounter: Payer: Self-pay | Admitting: Physical Therapy

## 2019-11-20 ENCOUNTER — Ambulatory Visit (INDEPENDENT_AMBULATORY_CARE_PROVIDER_SITE_OTHER): Payer: 59 | Admitting: Physical Therapy

## 2019-11-20 ENCOUNTER — Other Ambulatory Visit: Payer: Self-pay

## 2019-11-20 DIAGNOSIS — R293 Abnormal posture: Secondary | ICD-10-CM | POA: Diagnosis not present

## 2019-11-20 DIAGNOSIS — M25512 Pain in left shoulder: Secondary | ICD-10-CM | POA: Diagnosis not present

## 2019-11-20 DIAGNOSIS — M25612 Stiffness of left shoulder, not elsewhere classified: Secondary | ICD-10-CM | POA: Diagnosis not present

## 2019-11-20 DIAGNOSIS — R600 Localized edema: Secondary | ICD-10-CM

## 2019-11-20 NOTE — Therapy (Signed)
[place], [place], [address] Phone: [phone]   Fax:  [phone]  Physical Therapy Treatment  Patient Details  Name: George Young MRN: [id number] Date of Birth: 12-02-1966 Referring Provider (PT): Hiram Gash, MD   Encounter Date: 11/20/2019  PT End of Session - 11/20/19 1638    Visit Number  4    Number of Visits  24    Date for PT Re-Evaluation  02/06/20    Authorization Type  aetna    PT Start Time  1600    PT Stop Time  1645    PT Time Calculation (min)  45 min    Activity Tolerance  Patient tolerated treatment well    Behavior During Therapy  [facility] for tasks assessed/performed       Past Medical History:  Diagnosis Date  . Hiatal hernia   . Hyperlipemia   . Wrist fracture 2010    Past Surgical History:  Procedure Laterality Date  . ESOPHAGOGASTRODUODENOSCOPY    . HAIR TRANSPLANT     Dr. Eual Fines  . LASIK  08/2013   Right    There were no vitals filed for this visit.  Subjective Assessment - 11/20/19 1609    Subjective  Pt reports has tightness in Lt shoulder, but no pain.  "Exercises are going well".  Pt is anxious to be out of sling.    Pertinent History  has R arm tennis elbow, hypercholesterolemia, elevated sugar (A1C was 6 last time)    Patient Stated Goals  Be able to use the left arm as well as prior function.    Currently in Pain?  No/denies    Pain Score  0-No pain         [facility] PT Assessment - 11/20/19 0001      Assessment   Medical Diagnosis  M67.819 (ICD-10-CM) - Biceps tendonosis of shoulder; Z98.890 (ICD-10-CM) - History of bursectomy    Referring Provider (PT)  Hiram Gash, MD    Onset Date/Surgical Date  10/23/19    Hand Dominance  Right    Next MD Visit  11/20/19    Prior Therapy  had prior shoulder therapy before surgery      Precautions   Precautions  Shoulder    Required Braces or Orthoses  Sling      PROM   Right/Left Shoulder   Left    Left Shoulder Flexion  118 Degrees    Left Shoulder External Rotation  21 Degrees   supine, arm at side, scaption plane   Right/Left Elbow  Left    Left Elbow Flexion  135    Left Elbow Extension  0        [facility] Adult PT Treatment/Exercise - 11/20/19 0001      Shoulder Exercises: Supine   External Rotation  AAROM;Left;10 reps   cane   Flexion  AAROM;Both;10 reps   cane   ABduction  --      Shoulder Exercises: Standing   Internal Rotation  Both;10 reps;AAROM   cane behind back, 2 sets   Extension  AAROM;Both;10 reps   cane behind back   Other Standing Exercises  shoulder rolls each direction 10 reps    Other Standing Exercises  scap squeeze with back against pool noodle x 10 reps of 5 sec hold.       Vasopneumatic   Number Minutes Vasopneumatic   10 minutes    Vasopnuematic Location   Shoulder  Lt   Vasopneumatic Pressure  Low    Vasopneumatic Temperature   34 deg      Manual Therapy   Manual Therapy  Passive ROM;Soft tissue mobilization    Soft tissue mobilization  STM to Lt pec, lat, deltoid, bicep, subscap to decrease fascial tightness and improve ROM    Passive ROM  Lt shoulder into flexion, extension, external rotation, internal rotation                PT Short Term Goals - 11/20/19 1642      PT SHORT TERM GOAL #1   Title  The patient will be indep with HEP for pendulum, wrist flexion/extension, AAROM when indicated.    Time  6    Period  Weeks    Status  Achieved    Target Date  12/23/19      PT SHORT TERM GOAL #2   Title  The patient will improve AROM to full elbow AROM.    Time  6    Period  Weeks    Status  Achieved    Target Date  12/23/19      PT SHORT TERM GOAL #3   Title  The patient will have AROM L shoulder to 150 degrees flexion, 90 degrees abduction, 30 degrees ER to demonstrate improving AROM.    Baseline  PROM only performed today.    Time  6    Period  Weeks    Status  On-going    Target Date  12/23/19        PT  Long Term Goals - 11/09/19 1334      PT LONG TERM GOAL #1   Title  The patient will be indep with progression of HEP.    Time  12    Period  Weeks    Target Date  01/31/20      PT LONG TERM GOAL #2   Title  Improve FOTO to < or equal to 50% limited to demo improved function.    Time  12    Period  Weeks    Target Date  01/31/20      PT LONG TERM GOAL #3   Title  The patient will demonstrate functional AROM ER and IR L shoulder for improved IADL performance.    Time  12    Period  Weeks    Target Date  01/31/20      PT LONG TERM GOAL #4   Title  The patient will perform reaching across his body without pain to demo improved functional use of the L UE.    Time  12    Period  Weeks    Target Date  01/31/20            Plan - 11/20/19 1640    Clinical Impression Statement  Pt's Lt shoulder PROM/AAROM gradually progressing.  Rangel kept within tissue limits and no pain.  Pt has met STG #1 and 2 and is making good progress within rehab protocol.    Examination-Activity Limitations  Reach Overhead;Lift;Sleep    Stability/Clinical Decision Making  Stable/Uncomplicated    Rehab Potential  Good    PT Frequency  3x / week   begin at 2x/week until further progression of protocol   PT Duration  12 weeks    PT Treatment/Interventions  ADLs/Self Care Home Management;Cryotherapy;Electrical Stimulation;Ultrasound;Moist Heat;Therapeutic activities;Therapeutic exercise;Patient/family education;Neuromuscular re-education;Manual techniques;Vasopneumatic Device;Passive range of motion    PT Next Visit Plan  continue progressive Lt shoulder ROM  per protocol. - no isometrics until 6 wks post op.    PT Home Exercise Plan  Access Code: [email or web address]    Consulted and Agree with Plan of Care  Patient       Patient will benefit from skilled therapeutic intervention in order to improve the following deficits and impairments:  Pain, Increased edema, Decreased range of motion, Increased fascial  restricitons, Impaired UE functional use, Impaired flexibility, Improper body mechanics, Postural dysfunction  Visit Diagnosis: Stiffness of left shoulder, not elsewhere classified  Acute pain of left shoulder  Abnormal posture  Localized edema     Problem List Patient Active Problem List   Diagnosis Date Noted  . Tendinitis of long head of biceps brachii of left shoulder 09/29/2019  . Subacromial bursitis of left shoulder joint 09/29/2019  . Ganglion cyst 11/03/2013  . Hiatal hernia 11/11/2012  . FATIGUE 08/08/2010  . PSA, INCREASED 08/08/2010  . IFG (impaired fasting glucose) 09/26/2007  . HYPERCHOLESTEROLEMIA, PURE 07/12/2007  . ABNORMAL FINDINGS, ELEVATED BP W/O HTN 07/12/2007    Kerin Perna, PTA 11/20/19 4:49 PM  [place], [place], [address] Phone: [phone]   Fax:  [phone]  Name: Krist Rosenboom MRN: [id number] Date of Birth: 29-Jul-1966

## 2019-11-22 ENCOUNTER — Other Ambulatory Visit: Payer: Self-pay

## 2019-11-22 ENCOUNTER — Ambulatory Visit (INDEPENDENT_AMBULATORY_CARE_PROVIDER_SITE_OTHER): Payer: 59 | Admitting: Physical Therapy

## 2019-11-22 ENCOUNTER — Encounter: Payer: Self-pay | Admitting: Physical Therapy

## 2019-11-22 DIAGNOSIS — M25612 Stiffness of left shoulder, not elsewhere classified: Secondary | ICD-10-CM | POA: Diagnosis not present

## 2019-11-22 DIAGNOSIS — R600 Localized edema: Secondary | ICD-10-CM

## 2019-11-22 DIAGNOSIS — R293 Abnormal posture: Secondary | ICD-10-CM

## 2019-11-22 DIAGNOSIS — M25512 Pain in left shoulder: Secondary | ICD-10-CM | POA: Diagnosis not present

## 2019-11-22 NOTE — Therapy (Signed)
[place], [place], [address] Phone: [phone]   Fax:  [phone]  Physical Therapy Treatment  Patient Details  Name: George Young MRN: [id number] Date of Birth: [birth year] Referring Provider (PT): Hiram Gash, MD   Encounter Date: 11/22/2019  PT End of Session - 11/22/19 0800    Visit Number  5    Number of Visits  24    Date for PT Re-Evaluation  02/06/20    Authorization Type  aetna    PT Start Time  0800    PT Stop Time  0845    PT Time Calculation (min)  45 min    Activity Tolerance  Patient tolerated treatment well    Behavior During Therapy  [facility] for tasks assessed/performed       Past Medical History:  Diagnosis Date  . Hiatal hernia   . Hyperlipemia   . Wrist fracture 2010    Past Surgical History:  Procedure Laterality Date  . ESOPHAGOGASTRODUODENOSCOPY    . HAIR TRANSPLANT     Dr. Eual Fines  . LASIK  08/2013   Right    There were no vitals filed for this visit.  Subjective Assessment - 11/22/19 0801    Subjective  Pt doctors appt went well and said that everything looks good. He no longer has to wear his sling. He slept without his sling for first time last night; attributes his soreness to this.    Currently in Pain?  Yes    Pain Score  2     Pain Location  Shoulder    Pain Orientation  Left    Pain Descriptors / Indicators  Tightness    Pain Type  Surgical pain    Aggravating Factors   sleeping without sling    Pain Relieving Factors  resting, ice         [facility] PT Assessment - 11/22/19 0001      Assessment   Medical Diagnosis  M67.819 (ICD-10-CM) - Biceps tendonosis of shoulder; Z98.890 (ICD-10-CM) - History of bursectomy    Referring Provider (PT)  Hiram Gash, MD    Onset Date/Surgical Date  10/23/19    Hand Dominance  Right    Next MD Visit  01/23/20    Prior Therapy  had prior shoulder therapy before surgery       [facility] Adult PT  Treatment/Exercise - 11/22/19 0001      Shoulder Exercises: Supine   External Rotation  AAROM;Left;12 reps   with cane, cues to slow down and hold stretch   Flexion  AAROM;Both;12 reps   with cane     Shoulder Exercises: Standing   Internal Rotation  AAROM;Left;12 reps   with cane    Extension  AAROM;Both;12 reps   with cane   Other Standing Exercises  shoulder rolls 10 forward/backward    Other Standing Exercises  scap squeezes 10 reps with 5 sec hold with pool noodle      Vasopneumatic   Number Minutes Vasopneumatic   10 minutes    Vasopnuematic Location   Shoulder   Lt   Vasopneumatic Pressure  Low    Vasopneumatic Temperature   34 deg      Manual Therapy   Manual Therapy  Scapular mobilization    Soft tissue mobilization  STM to Lt pec, upper trap, levator, rhomboids    Scapular Mobilization  Lt UE medial/lateral    Passive ROM  Lt shoulder into extension, flexion, and  external rotation                PT Short Term Goals - 11/20/19 1642      PT SHORT TERM GOAL #1   Title  The patient will be indep with HEP for pendulum, wrist flexion/extension, AAROM when indicated.    Time  6    Period  Weeks    Status  Achieved    Target Date  12/23/19      PT SHORT TERM GOAL #2   Title  The patient will improve AROM to full elbow AROM.    Time  6    Period  Weeks    Status  Achieved    Target Date  12/23/19      PT SHORT TERM GOAL #3   Title  The patient will have AROM L shoulder to 150 degrees flexion, 90 degrees abduction, 30 degrees ER to demonstrate improving AROM.    Baseline  PROM only performed today.    Time  6    Period  Weeks    Status  On-going    Target Date  12/23/19        PT Long Term Goals - 11/09/19 1334      PT LONG TERM GOAL #1   Title  The patient will be indep with progression of HEP.    Time  12    Period  Weeks    Target Date  01/31/20      PT LONG TERM GOAL #2   Title  Improve FOTO to < or equal to 50% limited to demo improved  function.    Time  12    Period  Weeks    Target Date  01/31/20      PT LONG TERM GOAL #3   Title  The patient will demonstrate functional AROM ER and IR L shoulder for improved IADL performance.    Time  12    Period  Weeks    Target Date  01/31/20      PT LONG TERM GOAL #4   Title  The patient will perform reaching across his body without pain to demo improved functional use of the L UE.    Time  12    Period  Weeks    Target Date  01/31/20            Plan - 11/22/19 0844    Clinical Impression Statement  Pt tolerated AAROM exercises well today with no reports of increased pain. Pt less guarded with PROM exercises; performed within tissue limits. Pt continues to make progress toward STG # 3 and all other stated PT goals for functional mobility.    Examination-Activity Limitations  Reach Overhead;Lift;Sleep    Stability/Clinical Decision Making  Stable/Uncomplicated    Rehab Potential  Good    PT Frequency  3x / week   begin at 2x/week until further progression of protocol   PT Duration  12 weeks    PT Treatment/Interventions  ADLs/Self Care Home Management;Cryotherapy;Electrical Stimulation;Ultrasound;Moist Heat;Therapeutic activities;Therapeutic exercise;Patient/family education;Neuromuscular re-education;Manual techniques;Vasopneumatic Device;Passive range of motion    PT Next Visit Plan  continue progressive Lt shoulder ROM per protocol. - no isometrics until 6 wks post op.    PT Home Exercise Plan  Access Code: [email or web address]    Consulted and Agree with Plan of Care  Patient       Patient will benefit from skilled therapeutic intervention in order to improve the following deficits and impairments:  Pain, Increased  edema, Decreased range of motion, Increased fascial restricitons, Impaired UE functional use, Impaired flexibility, Improper body mechanics, Postural dysfunction  Visit Diagnosis: Stiffness of left shoulder, not elsewhere classified  Acute pain of left  shoulder  Abnormal posture  Localized edema     Problem List Patient Active Problem List   Diagnosis Date Noted  . Tendinitis of long head of biceps brachii of left shoulder 09/29/2019  . Subacromial bursitis of left shoulder joint 09/29/2019  . Ganglion cyst 11/03/2013  . Hiatal hernia 11/11/2012  . FATIGUE 08/08/2010  . PSA, INCREASED 08/08/2010  . IFG (impaired fasting glucose) 09/26/2007  . HYPERCHOLESTEROLEMIA, PURE 07/12/2007  . ABNORMAL FINDINGS, ELEVATED BP W/O HTN 07/12/2007    Olive Bass, SPTA 11/22/2019, 9:23 AM   Mayer Camel, PTA 11/22/19 9:23 AM   [facility] [address] [place], [place], [address] Phone: [phone]   Fax:  [phone]  Name: George Young MRN: [id number] Date of Birth: May 22, 1966

## 2019-11-27 ENCOUNTER — Other Ambulatory Visit: Payer: Self-pay

## 2019-11-27 ENCOUNTER — Ambulatory Visit (INDEPENDENT_AMBULATORY_CARE_PROVIDER_SITE_OTHER): Payer: 59 | Admitting: Physical Therapy

## 2019-11-27 DIAGNOSIS — M25612 Stiffness of left shoulder, not elsewhere classified: Secondary | ICD-10-CM | POA: Diagnosis not present

## 2019-11-27 DIAGNOSIS — M25512 Pain in left shoulder: Secondary | ICD-10-CM | POA: Diagnosis not present

## 2019-11-27 DIAGNOSIS — R600 Localized edema: Secondary | ICD-10-CM

## 2019-11-27 DIAGNOSIS — R293 Abnormal posture: Secondary | ICD-10-CM | POA: Diagnosis not present

## 2019-11-27 NOTE — Therapy (Signed)
[place], [place], [address] Phone: [phone]   Fax:  [phone]  Physical Therapy Treatment  Patient Details  Name: George Young MRN: [id number] Date of Birth: [birth year] Referring Provider (PT): Hiram Gash, MD   Encounter Date: 11/27/2019  PT End of Session - 11/27/19 1605    Visit Number  6    Number of Visits  24    Date for PT Re-Evaluation  02/06/20    Authorization Type  aetna    PT Start Time  1603    PT Stop Time  1641    PT Time Calculation (min)  38 min    Activity Tolerance  Patient tolerated treatment well    Behavior During Therapy  [facility] for tasks assessed/performed       Past Medical History:  Diagnosis Date  . Hiatal hernia   . Hyperlipemia   . Wrist fracture 2010    Past Surgical History:  Procedure Laterality Date  . ESOPHAGOGASTRODUODENOSCOPY    . HAIR TRANSPLANT     Dr. Eual Fines  . LASIK  08/2013   Right    There were no vitals filed for this visit.  Subjective Assessment - 11/27/19 1605    Subjective  Pt reports no new changes since last visit.    Currently in Pain?  No/denies    Pain Score  0-No pain         [facility] PT Assessment - 11/27/19 0001      Assessment   Medical Diagnosis  M67.819 (ICD-10-CM) - Biceps tendonosis of shoulder; Z98.890 (ICD-10-CM) - History of bursectomy    Referring Provider (PT)  Hiram Gash, MD    Onset Date/Surgical Date  10/23/19    Hand Dominance  Right    Next MD Visit  01/23/20    Prior Therapy  had prior shoulder therapy before surgery      PROM   Right/Left Shoulder  Left    Left Shoulder Flexion  134 Degrees AAROM in supine with cane   Left Shoulder External Rotation  30 Degrees  -  supine, scaption plane AAROM in supine with cane      [facility] Adult PT Treatment/Exercise - 11/27/19 0001      Self-Care   Self-Care  Other Self-Care Comments    Other Self-Care Comments   pt educate on self massage to  periscapular musculature with ball to decrease fascial tightness and pain.       Shoulder Exercises: Supine   External Rotation  AAROM;Left;10 reps   with cane   Flexion  AAROM;Both;10 reps   with cane     Shoulder Exercises: Standing   External Rotation  AAROM;Right;10 reps   arms at side   External Rotation Limitations  followed by AROM of bilat shoulders, arm at side ("L's") x 8 reps with mirror and tactile cues for form.     Internal Rotation  AAROM;Right;10 reps   cane, behind back and then up/over buttocks 10 each    Flexion  AROM;Left;10 reps   mirror for feedback on good form; to 90 deg    Extension  AAROM;Both;10 reps   with cane     Shoulder Exercises: Pulleys   Flexion  3 minutes      Vasopneumatic   Number Minutes Vasopneumatic   10 minutes    Vasopnuematic Location   Shoulder   Lt   Vasopneumatic Pressure  Low    Vasopneumatic Temperature   34 deg  PT Short Term Goals - 11/20/19 1642      PT SHORT TERM GOAL #1   Title  The patient will be indep with HEP for pendulum, wrist flexion/extension, AAROM when indicated.    Time  6    Period  Weeks    Status  Achieved    Target Date  12/23/19      PT SHORT TERM GOAL #2   Title  The patient will improve AROM to full elbow AROM.    Time  6    Period  Weeks    Status  Achieved    Target Date  12/23/19      PT SHORT TERM GOAL #3   Title  The patient will have AROM L shoulder to 150 degrees flexion, 90 degrees abduction, 30 degrees ER to demonstrate improving AROM.    Baseline  PROM only performed today.    Time  6    Period  Weeks    Status  On-going    Target Date  12/23/19        PT Long Term Goals - 11/09/19 1334      PT LONG TERM GOAL #1   Title  The patient will be indep with progression of HEP.    Time  12    Period  Weeks    Target Date  01/31/20      PT LONG TERM GOAL #2   Title  Improve FOTO to < or equal to 50% limited to demo improved function.    Time  12    Period   Weeks    Target Date  01/31/20      PT LONG TERM GOAL #3   Title  The patient will demonstrate functional AROM ER and IR L shoulder for improved IADL performance.    Time  12    Period  Weeks    Target Date  01/31/20      PT LONG TERM GOAL #4   Title  The patient will perform reaching across his body without pain to demo improved functional use of the L UE.    Time  12    Period  Weeks    Target Date  01/31/20            Plan - 11/27/19 1640    Clinical Impression Statement  Pt demonstrated improved Lt shoulder AAROM flexion and ER.  All exercises tolerated without pain and within protocol.  Progressing well towards goals.    Examination-Activity Limitations  Reach Overhead;Lift;Sleep    Stability/Clinical Decision Making  Stable/Uncomplicated    Rehab Potential  Good    PT Frequency  3x / week   begin at 2x/week until further progression of protocol   PT Duration  12 weeks    PT Treatment/Interventions  ADLs/Self Care Home Management;Cryotherapy;Electrical Stimulation;Ultrasound;Moist Heat;Therapeutic activities;Therapeutic exercise;Patient/family education;Neuromuscular re-education;Manual techniques;Vasopneumatic Device;Passive range of motion    PT Next Visit Plan  continue progressive Lt shoulder ROM per protocol. - no isometrics until 6 wks post op (12/04/19).    PT Home Exercise Plan  Access Code: [email or web address]    Consulted and Agree with Plan of Care  Patient       Patient will benefit from skilled therapeutic intervention in order to improve the following deficits and impairments:  Pain, Increased edema, Decreased range of motion, Increased fascial restricitons, Impaired UE functional use, Impaired flexibility, Improper body mechanics, Postural dysfunction  Visit Diagnosis: Stiffness of left shoulder, not elsewhere classified  Acute pain of  left shoulder  Abnormal posture  Localized edema     Problem List Patient Active Problem List   Diagnosis Date Noted  .  Tendinitis of long head of biceps brachii of left shoulder 09/29/2019  . Subacromial bursitis of left shoulder joint 09/29/2019  . Ganglion cyst 11/03/2013  . Hiatal hernia 11/11/2012  . FATIGUE 08/08/2010  . PSA, INCREASED 08/08/2010  . IFG (impaired fasting glucose) 09/26/2007  . HYPERCHOLESTEROLEMIA, PURE 07/12/2007  . ABNORMAL FINDINGS, ELEVATED BP W/O HTN 07/12/2007   Mayer CamelJennifer Carlson-Long, PTA 11/27/19 4:53 PM  [facility] [address] [place], [place], [address] Phone: [phone]   Fax:  [phone]  Name: George Young MRN: [id number] Date of Birth: March 01, 1966

## 2019-11-29 ENCOUNTER — Encounter: Payer: 59 | Admitting: Physical Therapy

## 2019-11-30 ENCOUNTER — Ambulatory Visit (INDEPENDENT_AMBULATORY_CARE_PROVIDER_SITE_OTHER): Payer: 59 | Admitting: Physical Therapy

## 2019-11-30 ENCOUNTER — Other Ambulatory Visit: Payer: Self-pay

## 2019-11-30 ENCOUNTER — Encounter: Payer: Self-pay | Admitting: Physical Therapy

## 2019-11-30 DIAGNOSIS — R293 Abnormal posture: Secondary | ICD-10-CM

## 2019-11-30 DIAGNOSIS — M25612 Stiffness of left shoulder, not elsewhere classified: Secondary | ICD-10-CM | POA: Diagnosis not present

## 2019-11-30 DIAGNOSIS — R600 Localized edema: Secondary | ICD-10-CM | POA: Diagnosis not present

## 2019-11-30 DIAGNOSIS — M25512 Pain in left shoulder: Secondary | ICD-10-CM

## 2019-11-30 DIAGNOSIS — M6281 Muscle weakness (generalized): Secondary | ICD-10-CM

## 2019-11-30 DIAGNOSIS — R29898 Other symptoms and signs involving the musculoskeletal system: Secondary | ICD-10-CM

## 2019-11-30 NOTE — Therapy (Signed)
[place], [place], [address] Phone: [phone]   Fax:  [phone]  Physical Therapy Treatment  Patient Details  Name: George Young MRN: [id number] Date of Birth: Aug 08, 1966 Referring Provider (PT): Hiram Gash, MD   Encounter Date: 11/30/2019  PT End of Session - 11/30/19 0807    Visit Number  7    Number of Visits  24    Date for PT Re-Evaluation  02/06/20    Authorization Type  aetna    PT Start Time  0805    PT Stop Time  0852    PT Time Calculation (min)  47 min    Activity Tolerance  Patient tolerated treatment well    Behavior During Therapy  [facility] for tasks assessed/performed       Past Medical History:  Diagnosis Date  . Hiatal hernia   . Hyperlipemia   . Wrist fracture 2010    Past Surgical History:  Procedure Laterality Date  . ESOPHAGOGASTRODUODENOSCOPY    . HAIR TRANSPLANT     Dr. Eual Fines  . LASIK  08/2013   Right    There were no vitals filed for this visit.  Subjective Assessment - 11/30/19 0809    Subjective  Pt reports increased stiffness in the morning.    Pertinent History  has R arm tennis elbow, hypercholesterolemia, elevated sugar (A1C was 6 last time)    Patient Stated Goals  Be able to use the left arm as well as prior function.    Currently in Pain?  Yes    Pain Score  3     Pain Location  Shoulder    Pain Orientation  Left    Pain Descriptors / Indicators  Tightness    Pain Type  Surgical pain                       [facility] Adult PT Treatment/Exercise - 11/30/19 0001      Shoulder Exercises: Supine   External Rotation  AAROM;Left;10 reps   with cane   Flexion  AAROM;Both;10 reps   with cane   Other Supine Exercises  cervical retraction x 5 3 sec hold      Shoulder Exercises: Standing   External Rotation  AAROM;Right;10 reps   arms at side   External Rotation Limitations  with towel under arm; followed by AROM of bilat  shoulders, arm at side ("L's") x 8 reps with mirror and tactile cues for form.     Internal Rotation  AAROM;Right;10 reps   cane, behind back and then up/over buttocks 10 each    Internal Rotation Limitations  TC's to inhibit levator    Flexion  AROM;Left;10 reps   mirror for feedback on good form; to 90 deg    ABduction  AAROM;10 reps    Extension  AAROM;Both;10 reps   with cane     Shoulder Exercises: Pulleys   Flexion  3 minutes      Modalities   Modalities  Vasopneumatic      Vasopneumatic   Number Minutes Vasopneumatic   10 minutes    Vasopnuematic Location   Shoulder   Lt   Vasopneumatic Pressure  Low    Vasopneumatic Temperature   34 deg      Manual Therapy   Manual Therapy  Soft tissue mobilization;Myofascial release;Passive ROM    Soft tissue mobilization  to left UT, levator scapula and subscapularis; cspine    Myofascial  Release  OA release; TPR to subscap with AA IR/ER    Passive ROM  to left arm into flex and ER               PT Short Term Goals - 11/20/19 1642      PT SHORT TERM GOAL #1   Title  The patient will be indep with HEP for pendulum, wrist flexion/extension, AAROM when indicated.    Time  6    Period  Weeks    Status  Achieved    Target Date  12/23/19      PT SHORT TERM GOAL #2   Title  The patient will improve AROM to full elbow AROM.    Time  6    Period  Weeks    Status  Achieved    Target Date  12/23/19      PT SHORT TERM GOAL #3   Title  The patient will have AROM L shoulder to 150 degrees flexion, 90 degrees abduction, 30 degrees ER to demonstrate improving AROM.    Baseline  PROM only performed today.    Time  6    Period  Weeks    Status  On-going    Target Date  12/23/19        PT Long Term Goals - 11/09/19 1334      PT LONG TERM GOAL #1   Title  The patient will be indep with progression of HEP.    Time  12    Period  Weeks    Target Date  01/31/20      PT LONG TERM GOAL #2   Title  Improve FOTO to < or equal  to 50% limited to demo improved function.    Time  12    Period  Weeks    Target Date  01/31/20      PT LONG TERM GOAL #3   Title  The patient will demonstrate functional AROM ER and IR L shoulder for improved IADL performance.    Time  12    Period  Weeks    Target Date  01/31/20      PT LONG TERM GOAL #4   Title  The patient will perform reaching across his body without pain to demo improved functional use of the L UE.    Time  12    Period  Weeks    Target Date  01/31/20            Plan - 11/30/19 0955    Clinical Impression Statement  Patient did well with TE today. He still needs cues to keep shoulder down with flex and AA IR behind back. C/o pain in bil neck today and had increased tension and TPs in left levator scapula. Good response to STW. Very tender in subscapularis.    PT Treatment/Interventions  ADLs/Self Care Home Management;Cryotherapy;Electrical Stimulation;Ultrasound;Moist Heat;Therapeutic activities;Therapeutic exercise;Patient/family education;Neuromuscular re-education;Manual techniques;Vasopneumatic Device;Passive range of motion    PT Next Visit Plan  continue progressive Lt shoulder ROM per protocol. - no isometrics until 6 wks post op (12/04/19).       Patient will benefit from skilled therapeutic intervention in order to improve the following deficits and impairments:  Pain, Increased edema, Decreased range of motion, Increased fascial restricitons, Impaired UE functional use, Impaired flexibility, Improper body mechanics, Postural dysfunction  Visit Diagnosis: Stiffness of left shoulder, not elsewhere classified  Acute pain of left shoulder  Localized edema  Abnormal posture  Other symptoms and signs  involving the musculoskeletal system  Muscle weakness (generalized)     Problem List Patient Active Problem List   Diagnosis Date Noted  . Tendinitis of long head of biceps brachii of left shoulder 09/29/2019  . Subacromial bursitis of left  shoulder joint 09/29/2019  . Ganglion cyst 11/03/2013  . Hiatal hernia 11/11/2012  . FATIGUE 08/08/2010  . PSA, INCREASED 08/08/2010  . IFG (impaired fasting glucose) 09/26/2007  . HYPERCHOLESTEROLEMIA, PURE 07/12/2007  . ABNORMAL FINDINGS, ELEVATED BP W/O HTN 07/12/2007    Solon Palm PT 11/30/2019, 10:01 AM  [facility] [address] [place], [place], [address] Phone: [phone]   Fax:  [phone]  Name: Kendarious Gudino MRN: [id number] Date of Birth: 12/10/1966

## 2019-12-01 ENCOUNTER — Ambulatory Visit (INDEPENDENT_AMBULATORY_CARE_PROVIDER_SITE_OTHER): Payer: 59 | Admitting: Physical Therapy

## 2019-12-01 ENCOUNTER — Encounter: Payer: Self-pay | Admitting: Physical Therapy

## 2019-12-01 DIAGNOSIS — M25612 Stiffness of left shoulder, not elsewhere classified: Secondary | ICD-10-CM

## 2019-12-01 DIAGNOSIS — R600 Localized edema: Secondary | ICD-10-CM | POA: Diagnosis not present

## 2019-12-01 DIAGNOSIS — R293 Abnormal posture: Secondary | ICD-10-CM | POA: Diagnosis not present

## 2019-12-01 DIAGNOSIS — M25512 Pain in left shoulder: Secondary | ICD-10-CM | POA: Diagnosis not present

## 2019-12-01 NOTE — Therapy (Signed)
[facility] [place] [address] [place], [place], [address] Phone: [phone]   Fax:  [phone]  Physical Therapy Treatment  Patient Details  Name: George Young MRN: [id number] Date of Birth: 1965-12-29 Referring Provider (PT): Bjorn Pippin, MD   Encounter Date: 12/01/2019  PT End of Session - 12/01/19 1620    Visit Number  8    Number of Visits  24    Date for PT Re-Evaluation  02/06/20    Authorization Type  aetna    PT Start Time  1531    PT Stop Time  1619    PT Time Calculation (min)  48 min    Activity Tolerance  Patient tolerated treatment well    Behavior During Therapy  [facility] for tasks assessed/performed       Past Medical History:  Diagnosis Date  . Hiatal hernia   . Hyperlipemia   . Wrist fracture 2010    Past Surgical History:  Procedure Laterality Date  . ESOPHAGOGASTRODUODENOSCOPY    . HAIR TRANSPLANT     Dr. Denna Haggard  . LASIK  08/2013   Right    There were no vitals filed for this visit.  Subjective Assessment - 12/01/19 1621    Subjective  Biggest complaint is stiffness.  He feels his is getting more ROM with supine cane flexion exercise.    Pertinent History  has R arm tennis elbow, hypercholesterolemia, elevated sugar (A1C was 6 last time)    Patient Stated Goals  Be able to use the left arm as well as prior function.    Currently in Pain?  Yes    Pain Score  2     Pain Location  Shoulder    Pain Orientation  Left    Pain Descriptors / Indicators  Dull;Tightness    Aggravating Factors   prolonged stillness    Pain Relieving Factors  ice         [facility] PT Assessment - 12/01/19 0001      Assessment   Medical Diagnosis  M67.819 (ICD-10-CM) - Biceps tendonosis of shoulder; Z98.890 (ICD-10-CM) - History of bursectomy    Referring Provider (PT)  Bjorn Pippin, MD    Onset Date/Surgical Date  10/23/19    Hand Dominance  Right    Next MD Visit  01/23/20    Prior Therapy  had prior  shoulder therapy before surgery      AROM   Left Shoulder Flexion  118 Degrees   standing   Left Shoulder Internal Rotation  --   thumb to L4     PROM   Right/Left Shoulder  Left    Left Shoulder Flexion  142 Degrees   AAROM supine with cane   Left Shoulder External Rotation  40 Degrees   supine, scaption plane       [facility] Adult PT Treatment/Exercise - 12/01/19 0001      Shoulder Exercises: Supine   External Rotation  AAROM;Left;10 reps   with cane, 10 sec hold.    Flexion  AAROM;Both;10 reps   with cane   Other Supine Exercises  scap squeeze x 5 sec x 5 - difficulty with technique; switched to standing.       Shoulder Exercises: Standing   External Rotation  AROM;Both;10 reps   "L's" with mirror for feedback   Internal Rotation  AAROM;Right;10 reps   cane, behind back and then up/over buttocks 10 each    Internal Rotation Limitations  AROM, 5  reps hand behind back.     Flexion  AROM;Left;10 reps    Other Standing Exercises  scap squeezes 10 reps with 5 sec hold with pool noodle      Shoulder Exercises: Pulleys   Flexion  3 minutes      Shoulder Exercises: Stretch   Other Shoulder Stretches  bilat bicep stretch in doorway x 20 sec x 2 reps       Vasopneumatic   Number Minutes Vasopneumatic   10 minutes    Vasopnuematic Location   Shoulder   Lt   Vasopneumatic Pressure  Low    Vasopneumatic Temperature   34 deg      Manual Therapy   Soft tissue mobilization  to Lt pec, subscap, thoracic paraspinals, rhomboid, upper trap, levator, lat.     Myofascial Release  Lt pec release, MFR to lat     Scapular Mobilization  Lt- medial/lateral mobilization    Passive ROM   left arm into ER               PT Short Term Goals - 11/20/19 1642      PT SHORT TERM GOAL #1   Title  The patient will be indep with HEP for pendulum, wrist flexion/extension, AAROM when indicated.    Time  6    Period  Weeks    Status  Achieved    Target Date  12/23/19      PT SHORT TERM  GOAL #2   Title  The patient will improve AROM to full elbow AROM.    Time  6    Period  Weeks    Status  Achieved    Target Date  12/23/19      PT SHORT TERM GOAL #3   Title  The patient will have AROM L shoulder to 150 degrees flexion, 90 degrees abduction, 30 degrees ER to demonstrate improving AROM.    Baseline  PROM only performed today.    Time  6    Period  Weeks    Status  On-going    Target Date  12/23/19        PT Long Term Goals - 11/09/19 1334      PT LONG TERM GOAL #1   Title  The patient will be indep with progression of HEP.    Time  12    Period  Weeks    Target Date  01/31/20      PT LONG TERM GOAL #2   Title  Improve FOTO to < or equal to 50% limited to demo improved function.    Time  12    Period  Weeks    Target Date  01/31/20      PT LONG TERM GOAL #3   Title  The patient will demonstrate functional AROM ER and IR L shoulder for improved IADL performance.    Time  12    Period  Weeks    Target Date  01/31/20      PT LONG TERM GOAL #4   Title  The patient will perform reaching across his body without pain to demo improved functional use of the L UE.    Time  12    Period  Weeks    Target Date  01/31/20            Plan - 12/01/19 1616    Clinical Impression Statement  Good improvement in Lt shoulder AAROM.  Tolerated exercises well, without pain.  Progressing well  within rehab protocol.    Rehab Potential  Good    PT Frequency  3x / week    PT Duration  12 weeks    PT Treatment/Interventions  ADLs/Self Care Home Management;Cryotherapy;Electrical Stimulation;Ultrasound;Moist Heat;Therapeutic activities;Therapeutic exercise;Patient/family education;Neuromuscular re-education;Manual techniques;Vasopneumatic Device;Passive range of motion    PT Next Visit Plan  continue progressive Lt shoulder ROM per protocol. - no isometrics until 6 wks post op (12/04/19).       Patient will benefit from skilled therapeutic intervention in order to improve  the following deficits and impairments:  Pain, Increased edema, Decreased range of motion, Increased fascial restricitons, Impaired UE functional use, Impaired flexibility, Improper body mechanics, Postural dysfunction  Visit Diagnosis: Stiffness of left shoulder, not elsewhere classified  Acute pain of left shoulder  Localized edema  Abnormal posture     Problem List Patient Active Problem List   Diagnosis Date Noted  . Tendinitis of long head of biceps brachii of left shoulder 09/29/2019  . Subacromial bursitis of left shoulder joint 09/29/2019  . Ganglion cyst 11/03/2013  . Hiatal hernia 11/11/2012  . FATIGUE 08/08/2010  . PSA, INCREASED 08/08/2010  . IFG (impaired fasting glucose) 09/26/2007  . HYPERCHOLESTEROLEMIA, PURE 07/12/2007  . ABNORMAL FINDINGS, ELEVATED BP W/O HTN 07/12/2007   Mayer CamelJennifer Carlson-Long, PTA 12/01/19 4:24 PM  [facility] [place] [address] [place], [place], [address] Phone: [phone]   Fax:  [phone]  Name: George Young MRN: [id number] Date of Birth: Jan 22, 1966

## 2019-12-04 ENCOUNTER — Other Ambulatory Visit: Payer: Self-pay

## 2019-12-04 ENCOUNTER — Ambulatory Visit (INDEPENDENT_AMBULATORY_CARE_PROVIDER_SITE_OTHER): Payer: 59 | Admitting: Physical Therapy

## 2019-12-04 ENCOUNTER — Other Ambulatory Visit: Payer: Self-pay | Admitting: *Deleted

## 2019-12-04 DIAGNOSIS — E7800 Pure hypercholesterolemia, unspecified: Secondary | ICD-10-CM

## 2019-12-04 DIAGNOSIS — R600 Localized edema: Secondary | ICD-10-CM

## 2019-12-04 DIAGNOSIS — M25512 Pain in left shoulder: Secondary | ICD-10-CM

## 2019-12-04 DIAGNOSIS — R293 Abnormal posture: Secondary | ICD-10-CM | POA: Diagnosis not present

## 2019-12-04 DIAGNOSIS — M25612 Stiffness of left shoulder, not elsewhere classified: Secondary | ICD-10-CM | POA: Diagnosis not present

## 2019-12-04 MED ORDER — ATORVASTATIN CALCIUM 10 MG PO TABS: 10.0000 mg | ORAL_TABLET | Freq: Every day | ORAL | 3 refills | Status: DC

## 2019-12-04 NOTE — Patient Instructions (Signed)
Access Code: [email or web address]  URL: [email or web address]  Date: 12/04/2019  Prepared by: Kerin Perna   Exercises  Seated Scapular Retraction - 10 reps - 1 sets - 5-10 seconds hold - 2x daily - 7x weekly  Standing Shoulder Extension with Dowel - 10 reps - 1 sets - 2-5 seconds hold - 2x daily - 7x weekly  Standing Shoulder Internal Rotation AAROM with Dowel - 10 reps - 1 sets - 2x daily - 7x weekly  Seated Shoulder External Rotation AAROM with Cane and Hand in Neutral - 10 reps - 1 sets - 5 hold - 2x daily - 7x weekly  Supine Shoulder Abduction AAROM with Dowel - 10 reps - 1 sets - 5-10 second hold - 1x daily - 7x weekly  Seated Shoulder Flexion Towel Slide at Table Top - 5-10 reps - 1 sets - 5-10 hold - 2x daily - 7x weekly  Supine Shoulder Flexion Extension AAROM with Dowel - 10 reps - 1 sets - 5-10 hold - 2x daily - 7x weekly  Isometric Shoulder Flexion at Wall - 10 reps - 2 sets - 5-10 hold - 2x daily - 7x weekly  Isometric Shoulder Abduction at Wall - 10 reps - 2 sets - 5-10 hold - 2x daily - 7x weekly  Standing Isometric Shoulder Extension at Table - 10 reps - 2 sets - 5-10 hold - 2x daily - 7x weekly  Standing Isometric Shoulder External Rotation with Doorway and Towel Roll - 10 reps - 1 sets - 5-10 hold - 2x daily - 7x weekly  Standing Isometric Shoulder Internal Rotation with Towel Roll at Doorway - 10 reps - 1 sets - 5-10 hold - 2x daily - 7x weekly

## 2019-12-04 NOTE — Therapy (Signed)
[facility] [place] [address] [place], [place], [address] Phone: [phone]   Fax:  [phone]  Physical Therapy Treatment  Patient Details  Name: George Young MRN: [id number] Date of Birth: 1966/07/26 Referring Provider (PT): Bjorn Pippin, MD   Encounter Date: 12/04/2019  PT End of Session - 12/04/19 1630    Visit Number  9    Number of Visits  24    Date for PT Re-Evaluation  02/06/20    Authorization Type  aetna    PT Start Time  1604    PT Stop Time  1649    PT Time Calculation (min)  45 min    Activity Tolerance  Patient tolerated treatment well;No increased pain    Behavior During Therapy  WFL for tasks assessed/performed       Past Medical History:  Diagnosis Date  . Hiatal hernia   . Hyperlipemia   . Wrist fracture 2010    Past Surgical History:  Procedure Laterality Date  . ESOPHAGOGASTRODUODENOSCOPY    . HAIR TRANSPLANT     Dr. Denna Haggard  . LASIK  08/2013   Right    There were no vitals filed for this visit.  Subjective Assessment - 12/04/19 1658    Subjective  Pt reports no new changes.    Pertinent History  has R arm tennis elbow, hypercholesterolemia, elevated sugar (A1C was 6 last time)    Patient Stated Goals  Be able to use the left arm as well as prior function.    Currently in Pain?  Yes    Pain Score  3     Pain Location  Shoulder    Pain Orientation  Left;Anterior;Upper    Pain Descriptors / Indicators  Sore    Aggravating Factors   prolonged stillness; rolling onto shoulder at night.    Pain Relieving Factors  ice         [facility] PT Assessment - 12/04/19 0001      Assessment   Medical Diagnosis  M67.819 (ICD-10-CM) - Biceps tendonosis of shoulder; Z98.890 (ICD-10-CM) - History of bursectomy    Referring Provider (PT)  Bjorn Pippin, MD    Onset Date/Surgical Date  10/23/19    Hand Dominance  Right    Next MD Visit  01/23/20    Prior Therapy  had prior shoulder therapy  before surgery      AROM   Left Shoulder External Rotation  55 Degrees   scaption, standing     PROM   Left Shoulder Flexion  150 Degrees    Left Shoulder External Rotation  40 Degrees   supine, scaption plane       [facility] Adult PT Treatment/Exercise - 12/04/19 0001      Shoulder Exercises: Supine   External Rotation  AAROM;Left;10 reps   with cane, 10 sec hold.    Flexion  AAROM;Both;10 reps   with cane   ABduction  AAROM;Left;10 reps   cane     Shoulder Exercises: Pulleys   Flexion  3 minutes    Scaption  2 minutes      Shoulder Exercises: Isometric Strengthening   Flexion  --   5 sec x 10   Extension  --   5 sec x 10   External Rotation  --   5 sec, 10    Internal Rotation  --   5 sec x 10   ABduction  --   5 sec x 10  Shoulder Exercises: Stretch   Other Shoulder Stretches  bilat bicep shoulder stretch x 2 reps of 20 sec     Other Shoulder Stretches  bilat shoulder flexion stretch, holding sink and walking backwards x 15 sec; verbally reviewed current HEP.       Vasopneumatic   Number Minutes Vasopneumatic   10 minutes    Vasopnuematic Location   Shoulder   Lt   Vasopneumatic Pressure  Low    Vasopneumatic Temperature   34 deg      Manual Therapy   Manual therapy comments  I strip of reg Rock tape applied to Lt ant shoulder and 1 strip applied perpendicular to it, with 20% stretch, to decompress tissue, decrease pain, increase proprioception              PT Education - 12/04/19 1703    Education Details  HEP    Person(s) Educated  Patient    Methods  Explanation;Demonstration;Handout;Verbal cues    Comprehension  Returned demonstration;Verbalized understanding       PT Short Term Goals - 11/20/19 1642      PT SHORT TERM GOAL #1   Title  The patient will be indep with HEP for pendulum, wrist flexion/extension, AAROM when indicated.    Time  6    Period  Weeks    Status  Achieved    Target Date  12/23/19      PT SHORT TERM GOAL #2    Title  The patient will improve AROM to full elbow AROM.    Time  6    Period  Weeks    Status  Achieved    Target Date  12/23/19      PT SHORT TERM GOAL #3   Title  The patient will have AROM L shoulder to 150 degrees flexion, 90 degrees abduction, 30 degrees ER to demonstrate improving AROM.    Baseline  PROM only performed today.    Time  6    Period  Weeks    Status  On-going    Target Date  12/23/19        PT Long Term Goals - 11/09/19 1334      PT LONG TERM GOAL #1   Title  The patient will be indep with progression of HEP.    Time  12    Period  Weeks    Target Date  01/31/20      PT LONG TERM GOAL #2   Title  Improve FOTO to < or equal to 50% limited to demo improved function.    Time  12    Period  Weeks    Target Date  01/31/20      PT LONG TERM GOAL #3   Title  The patient will demonstrate functional AROM ER and IR L shoulder for improved IADL performance.    Time  12    Period  Weeks    Target Date  01/31/20      PT LONG TERM GOAL #4   Title  The patient will perform reaching across his body without pain to demo improved functional use of the L UE.    Time  12    Period  Weeks    Target Date  01/31/20            Plan - 12/04/19 1657    Clinical Impression Statement  Continued improvement in Lt shoulder ROM. Tolerated new exercises well, without pain, and minor cues for form.  Progressing  well within rehab protocol.    Rehab Potential  Good    PT Frequency  3x / week    PT Duration  12 weeks    PT Treatment/Interventions  ADLs/Self Care Home Management;Cryotherapy;Electrical Stimulation;Ultrasound;Moist Heat;Therapeutic activities;Therapeutic exercise;Patient/family education;Neuromuscular re-education;Manual techniques;Vasopneumatic Device;Passive range of motion    PT Next Visit Plan  continue progressive Lt shoulder ROM/light strengthening per protocol. Assess response to tape; reapply if helpful.    PT Home Exercise Plan  Access Code: [email or web address]        Patient will benefit from skilled therapeutic intervention in order to improve the following deficits and impairments:  Pain, Increased edema, Decreased range of motion, Increased fascial restricitons, Impaired UE functional use, Impaired flexibility, Improper body mechanics, Postural dysfunction  Visit Diagnosis: Stiffness of left shoulder, not elsewhere classified  Acute pain of left shoulder  Localized edema  Abnormal posture     Problem List Patient Active Problem List   Diagnosis Date Noted  . Tendinitis of long head of biceps brachii of left shoulder 09/29/2019  . Subacromial bursitis of left shoulder joint 09/29/2019  . Ganglion cyst 11/03/2013  . Hiatal hernia 11/11/2012  . FATIGUE 08/08/2010  . PSA, INCREASED 08/08/2010  . IFG (impaired fasting glucose) 09/26/2007  . HYPERCHOLESTEROLEMIA, PURE 07/12/2007  . ABNORMAL FINDINGS, ELEVATED BP W/O HTN 07/12/2007   Kerin Perna, PTA 12/04/19 5:04 PM  [facility] [place], [place], [address] Phone: [phone]   Fax:  [phone]  Name: George Young MRN: [id number] Date of Birth: Oct 29, 1966

## 2019-12-06 ENCOUNTER — Other Ambulatory Visit: Payer: Self-pay

## 2019-12-06 ENCOUNTER — Ambulatory Visit (INDEPENDENT_AMBULATORY_CARE_PROVIDER_SITE_OTHER): Payer: 59 | Admitting: Physical Therapy

## 2019-12-06 DIAGNOSIS — R600 Localized edema: Secondary | ICD-10-CM | POA: Diagnosis not present

## 2019-12-06 DIAGNOSIS — M25612 Stiffness of left shoulder, not elsewhere classified: Secondary | ICD-10-CM | POA: Diagnosis not present

## 2019-12-06 DIAGNOSIS — R293 Abnormal posture: Secondary | ICD-10-CM | POA: Diagnosis not present

## 2019-12-06 DIAGNOSIS — R29898 Other symptoms and signs involving the musculoskeletal system: Secondary | ICD-10-CM

## 2019-12-06 DIAGNOSIS — M25512 Pain in left shoulder: Secondary | ICD-10-CM

## 2019-12-06 NOTE — Therapy (Addendum)
[facility] [place] [address] [place], [place], [address] Phone: [phone]   Fax:  [phone]  Physical Therapy Treatment  Patient Details  Name: George DenseFernando Bouza MRN: [id number] Date of Birth: 16-Mar-1966 Referring Provider (PT): Bjorn PippinVarkey, Dax T, MD   Encounter Date: 12/06/2019  PT End of Session - 12/06/19 1115    Visit Number  10    Number of Visits  24    Date for PT Re-Evaluation  02/06/20    Authorization Type  aetna    PT Start Time  1103    PT Stop Time  1144    PT Time Calculation (min)  41 min    Activity Tolerance  Patient tolerated treatment well;No increased pain    Behavior During Therapy  WFL for tasks assessed/performed       Past Medical History:  Diagnosis Date  . Hiatal hernia   . Hyperlipemia   . Wrist fracture 2010    Past Surgical History:  Procedure Laterality Date  . ESOPHAGOGASTRODUODENOSCOPY    . HAIR TRANSPLANT     Dr. Denna HaggardAngela Phipps  . LASIK  08/2013   Right    There were no vitals filed for this visit.  Subjective Assessment - 12/06/19 1120    Subjective  Pt reports the tape has helped shoulder, "I feel more confident, and incisions are not as tender".    Pertinent History  has R arm tennis elbow, hypercholesterolemia, elevated sugar (A1C was 6 last time)    Patient Stated Goals  Be able to use the left arm as well as prior function.    Currently in Pain?  Yes    Pain Score  2     Pain Location  Shoulder    Pain Descriptors / Indicators  Sore         [facility] PT Assessment - 12/06/19 0001      Assessment   Medical Diagnosis  M67.819 (ICD-10-CM) - Biceps tendonosis of shoulder; Z98.890 (ICD-10-CM) - History of bursectomy    Referring Provider (PT)  Bjorn PippinVarkey, Dax T, MD    Onset Date/Surgical Date  10/23/19    Hand Dominance  Right    Next MD Visit  01/23/20    Prior Therapy  had prior shoulder therapy before surgery        [facility] - 12/06/19 0001       Shoulder Exercises: Supine   Protraction  Left;10 reps;AROM    External Rotation  AAROM;Left;10 reps   with cane, 10 sec hold.    External Rotation Limitations  cues to slow pace    Flexion  AAROM;Left;12 reps   with cane   ABduction  AAROM;Left;12 reps   cane     Shoulder Exercises: Prone   Retraction  Left;10 reps;AROM   tactile cues for form   Extension  AROM;Left;12 reps   with retraction    Horizontal ABduction 1  AROM;Left;5 reps   with retraction; limited motion   Other [place]one Exercises  Lt prone row to neutral with scap retraction x 3 reps, pt reported pain in bicep/ant shoulder; stopped.       Shoulder Exercises: Standing   Other Standing Exercises  scap squeeze with W's x 12 reps       Shoulder Exercises: Pulleys   Flexion  2 minutes    Scaption  2 minutes      Shoulder Exercises: ROM/Strengthening   Rhythmic Stabilization, Supine  90 deg flexion LUE x 20 sec with  light perterbations      Shoulder Exercises: Stretch   Other Shoulder Stretches  bilat bicep shoulder stretch x 3 reps of 20 sec       Vasopneumatic   Number Minutes Vasopneumatic   10 minutes    Vasopnuematic Location   Shoulder   Lt   Vasopneumatic Pressure  Low    Vasopneumatic Temperature   34 deg      Manual Therapy   Manual therapy comments  I strip of reg Rock tape applied to [employer] shoulder and 1 strip applied perpendicular to it, with 20% stretch, to decompress tissue, decrease pain, increase proprioception                PT Short Term Goals - 11/20/19 1642      PT SHORT TERM GOAL #1   Title  The patient will be indep with HEP for pendulum, wrist flexion/extension, AAROM when indicated.    Time  6    Period  Weeks    Status  Achieved    Target Date  12/23/19      PT SHORT TERM GOAL #2   Title  The patient will improve AROM to full elbow AROM.    Time  6    Period  Weeks    Status  Achieved    Target Date  12/23/19      PT SHORT TERM GOAL #3   Title  The patient will have  AROM L shoulder to 150 degrees flexion, 90 degrees abduction, 30 degrees ER to demonstrate improving AROM.    Baseline  PROM only performed today.    Time  6    Period  Weeks    Status  On-going    Target Date  12/23/19        PT Long Term Goals - 11/09/19 1334      PT LONG TERM GOAL #1   Title  The patient will be indep with progression of HEP.    Time  12    Period  Weeks    Target Date  01/31/20      PT LONG TERM GOAL #2   Title  Improve FOTO to < or equal to 50% limited to demo improved function.    Time  12    Period  Weeks    Target Date  01/31/20      PT LONG TERM GOAL #3   Title  The patient will demonstrate functional AROM ER and IR L shoulder for improved IADL performance.    Time  12    Period  Weeks    Target Date  01/31/20      PT LONG TERM GOAL #4   Title  The patient will perform reaching across his body without pain to demo improved functional use of the L UE.    Time  12    Period  Weeks    Target Date  01/31/20            Plan - 12/06/19 1116    Clinical Impression Statement  Pt unable to tolerate prone row with to neutral with LUE due to increased pain in bicep; tolerated scap retraction and ext to neutral in prone well.  Pt tolerated all other exercises well, without increase in pain.  Progressing well within rehab protocol.       Patient will benefit from skilled therapeutic intervention in order to improve the following deficits and impairments:     Visit Diagnosis: Stiffness of  left shoulder, not elsewhere classified  Acute pain of left shoulder  Localized edema  Abnormal posture  Other symptoms and signs involving the musculoskeletal system     Problem List Patient Active Problem List   Diagnosis Date Noted  . Tendinitis of long head of biceps brachii of left shoulder 09/29/2019  . Subacromial bursitis of left shoulder joint 09/29/2019  . Ganglion cyst 11/03/2013  . Hiatal hernia 11/11/2012  . FATIGUE 08/08/2010  . PSA,  INCREASED 08/08/2010  . IFG (impaired fasting glucose) 09/26/2007  . HYPERCHOLESTEROLEMIA, PURE 07/12/2007  . ABNORMAL FINDINGS, ELEVATED BP W/O HTN 07/12/2007   Kerin Perna, PTA 12/06/19 2:14 PM  [place], [place], [address] Phone: [phone]   Fax:  [phone]  Name: Ramesh Moan MRN: [id number] Date of Birth: [birth year]

## 2019-12-08 ENCOUNTER — Other Ambulatory Visit: Payer: Self-pay

## 2019-12-08 ENCOUNTER — Ambulatory Visit (INDEPENDENT_AMBULATORY_CARE_PROVIDER_SITE_OTHER): Payer: 59 | Admitting: Physical Therapy

## 2019-12-08 ENCOUNTER — Encounter: Payer: Self-pay | Admitting: Physical Therapy

## 2019-12-08 DIAGNOSIS — M25512 Pain in left shoulder: Secondary | ICD-10-CM | POA: Diagnosis not present

## 2019-12-08 DIAGNOSIS — R293 Abnormal posture: Secondary | ICD-10-CM

## 2019-12-08 DIAGNOSIS — M25612 Stiffness of left shoulder, not elsewhere classified: Secondary | ICD-10-CM

## 2019-12-08 DIAGNOSIS — R29898 Other symptoms and signs involving the musculoskeletal system: Secondary | ICD-10-CM

## 2019-12-08 DIAGNOSIS — R600 Localized edema: Secondary | ICD-10-CM | POA: Diagnosis not present

## 2019-12-08 DIAGNOSIS — M6281 Muscle weakness (generalized): Secondary | ICD-10-CM

## 2019-12-08 NOTE — Therapy (Signed)
[facility] [address] [place], [place], [address] Phone: [phone]   Fax:  [phone]  Physical Therapy Treatment  Patient Details  Name: George Young MRN: [id number] Date of Birth: 07-Oct-1966 Referring Provider (PT): Bjorn Pippin, MD   Encounter Date: 12/08/2019  PT End of Session - 12/08/19 1342    Visit Number  11    Number of Visits  24    Date for PT Re-Evaluation  02/06/20    Authorization Type  aetna    Authorization - Number of Visits  60    PT Start Time  0845    PT Stop Time  0940    PT Time Calculation (min)  55 min    Activity Tolerance  Patient tolerated treatment well;No increased pain    Behavior During Therapy  WFL for tasks assessed/performed       Past Medical History:  Diagnosis Date  . Hiatal hernia   . Hyperlipemia   . Wrist fracture 2010    Past Surgical History:  Procedure Laterality Date  . ESOPHAGOGASTRODUODENOSCOPY    . HAIR TRANSPLANT     Dr. Denna Haggard  . LASIK  08/2013   Right    There were no vitals filed for this visit.  Subjective Assessment - 12/08/19 1340    Subjective  Pt reporting taping over incision sites seem to make it feel better and not as tender.    Pertinent History  has R arm tennis elbow, hypercholesterolemia, elevated sugar (A1C was 6 last time)    Patient Stated Goals  Be able to use the left arm as well as prior function.    Pain Score  2     Pain Location  Shoulder    Pain Orientation  Left;Anterior    Pain Descriptors / Indicators  Sore    Pain Type  Surgical pain;Acute pain    Pain Onset  1 to 4 weeks ago                       [facility] Adult PT Treatment/Exercise - 12/08/19 0001      Shoulder Exercises: Supine   Protraction  AROM;Left;15 reps    External Rotation  AAROM;Left;10 reps   with cane, 10 sec hold.    External Rotation Limitations  cues to slow pace    Flexion  AAROM;Left;12 reps   with cane     Shoulder  Exercises: Prone   Retraction  Left;10 reps;AROM   tactile cues for form   Extension  AROM;Left;12 reps   with retraction    Horizontal ABduction 1  AROM;Left;5 reps   with retraction; limited motion   Other Prone Exercises  Lt prone row to neutral with scap retraction x 3 reps, pt reported pain in bicep/ant shoulder; stopped.       Shoulder Exercises: Standing   Other Standing Exercises  body blade with shoulder in neutral position with oscillations with elbow extended.       Shoulder Exercises: Pulleys   Flexion  2 minutes    Scaption  2 minutes      Shoulder Exercises: ROM/Strengthening   Rhythmic Stabilization, Supine  90 deg flexion LUE x 20 sec with light perterbations      Shoulder Exercises: Stretch   Other Shoulder Stretches  bilat bicep shoulder stretch x 3 reps of 20 sec       Vasopneumatic   Number Minutes Vasopneumatic   10 minutes  Vasopnuematic Location   Shoulder    Vasopneumatic Pressure  Low    Vasopneumatic Temperature   34      Manual Therapy   Manual therapy comments  I strip of reg Rock tape applied to Lt ant shoulder and 1 strip applied perpendicular to it, with 20% stretch, to decompress tissue, decrease pain, increase proprioception    5 minutes            PT Education - 12/08/19 1341    Education Details  Exercise techniques    Person(s) Educated  Patient    Methods  Explanation    Comprehension  Verbalized understanding;Returned demonstration       PT Short Term Goals - 11/20/19 1642      PT SHORT TERM GOAL #1   Title  The patient will be indep with HEP for pendulum, wrist flexion/extension, AAROM when indicated.    Time  6    Period  Weeks    Status  Achieved    Target Date  12/23/19      PT SHORT TERM GOAL #2   Title  The patient will improve AROM to full elbow AROM.    Time  6    Period  Weeks    Status  Achieved    Target Date  12/23/19      PT SHORT TERM GOAL #3   Title  The patient will have AROM L shoulder to 150  degrees flexion, 90 degrees abduction, 30 degrees ER to demonstrate improving AROM.    Baseline  PROM only performed today.    Time  6    Period  Weeks    Status  On-going    Target Date  12/23/19        PT Long Term Goals - 11/09/19 1334      PT LONG TERM GOAL #1   Title  The patient will be indep with progression of HEP.    Time  12    Period  Weeks    Target Date  01/31/20      PT LONG TERM GOAL #2   Title  Improve FOTO to < or equal to 50% limited to demo improved function.    Time  12    Period  Weeks    Target Date  01/31/20      PT LONG TERM GOAL #3   Title  The patient will demonstrate functional AROM ER and IR L shoulder for improved IADL performance.    Time  12    Period  Weeks    Target Date  01/31/20      PT LONG TERM GOAL #4   Title  The patient will perform reaching across his body without pain to demo improved functional use of the L UE.    Time  12    Period  Weeks    Target Date  01/31/20            Plan - 12/08/19 1345    Clinical Impression Statement  Pt tolerating all exercises well today. Pt still reporting pain over anterior shoulder and bicep tendon. Rock tape applied over L shoulder incisions to help with "tenderness/pain".  Continue with skilled PT    Examination-Activity Limitations  Reach Overhead;Lift;Sleep    Stability/Clinical Decision Making  Stable/Uncomplicated    Rehab Potential  Good    PT Frequency  3x / week    PT Duration  12 weeks    PT Treatment/Interventions  ADLs/Self Care Home  Management;Cryotherapy;Electrical Stimulation;Ultrasound;Moist Heat;Therapeutic activities;Therapeutic exercise;Patient/family education;Neuromuscular re-education;Manual techniques;Vasopneumatic Device;Passive range of motion    PT Next Visit Plan  continue progressive Lt shoulder ROM/light strengthening per protocol. Assess response to tape; reapply if helpful.    PT Home Exercise Plan  Access Code: [email or web address]    Consulted and Agree with Plan of  Care  Patient       Patient will benefit from skilled therapeutic intervention in order to improve the following deficits and impairments:  Pain, Increased edema, Decreased range of motion, Increased fascial restricitons, Impaired UE functional use, Impaired flexibility, Improper body mechanics, Postural dysfunction  Visit Diagnosis: Stiffness of left shoulder, not elsewhere classified  Acute pain of left shoulder  Localized edema  Abnormal posture  Other symptoms and signs involving the musculoskeletal system  Muscle weakness (generalized)     Problem List Patient Active Problem List   Diagnosis Date Noted  . Tendinitis of long head of biceps brachii of left shoulder 09/29/2019  . Subacromial bursitis of left shoulder joint 09/29/2019  . Ganglion cyst 11/03/2013  . Hiatal hernia 11/11/2012  . FATIGUE 08/08/2010  . PSA, INCREASED 08/08/2010  . IFG (impaired fasting glucose) 09/26/2007  . HYPERCHOLESTEROLEMIA, PURE 07/12/2007  . ABNORMAL FINDINGS, ELEVATED BP W/O HTN 07/12/2007    Sharmon LeydenJennifer R Nyeshia Mysliwiec ,PT 12/08/2019, 1:47 PM  [facility] [address] [place], [place], [address] Phone: [phone]   Fax:  [phone]  Name: George Young MRN: [id number] Date of Birth: September 06, 1966

## 2019-12-11 ENCOUNTER — Ambulatory Visit (INDEPENDENT_AMBULATORY_CARE_PROVIDER_SITE_OTHER): Payer: 59 | Admitting: Physical Therapy

## 2019-12-11 DIAGNOSIS — R600 Localized edema: Secondary | ICD-10-CM | POA: Diagnosis not present

## 2019-12-11 DIAGNOSIS — R293 Abnormal posture: Secondary | ICD-10-CM | POA: Diagnosis not present

## 2019-12-11 DIAGNOSIS — M25512 Pain in left shoulder: Secondary | ICD-10-CM | POA: Diagnosis not present

## 2019-12-11 DIAGNOSIS — M25612 Stiffness of left shoulder, not elsewhere classified: Secondary | ICD-10-CM

## 2019-12-11 NOTE — Therapy (Signed)
[place], [place], [address] Phone: [phone]   Fax:  [phone]  Physical Therapy Treatment  Patient Details  Name: George Young MRN: [id number] Date of Birth: 11/02/1966 Referring Provider (PT): Hiram Gash, MD   Encounter Date: 12/11/2019  PT End of Session - 12/11/19 1427    Visit Number  12    Number of Visits  24    Date for PT Re-Evaluation  02/06/20    Authorization Type  aetna    Authorization - Number of Visits  54    PT Start Time  1351    PT Stop Time  1435    PT Time Calculation (min)  44 min    Activity Tolerance  Patient tolerated treatment well;No increased pain    Behavior During Therapy  WFL for tasks assessed/performed       Past Medical History:  Diagnosis Date  . Hiatal hernia   . Hyperlipemia   . Wrist fracture 2010    Past Surgical History:  Procedure Laterality Date  . ESOPHAGOGASTRODUODENOSCOPY    . HAIR TRANSPLANT     Dr. Eual Fines  . LASIK  08/2013   Right    There were no vitals filed for this visit.  Subjective Assessment - 12/11/19 1355    Subjective  Pt reports no new changes since last visit.    Patient Stated Goals  Be able to use the left arm as well as prior function.    Currently in Pain?  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.819 (ICD-10-CM) - Biceps tendonosis of shoulder; Z98.890 (ICD-10-CM) - History of bursectomy    Referring Provider (PT)  Hiram Gash, MD    Onset Date/Surgical Date  10/23/19    Hand Dominance  Right    Next MD Visit  01/23/20    Prior Therapy  had prior shoulder therapy before surgery      PROM   Left Shoulder External Rotation  40 Degrees   supine, scaption plane     [facility] Adult PT Treatment/Exercise - 12/11/19 0001      Shoulder Exercises: Supine   Other Supine Exercises  Lt shoulder 90 deg flexion, with ABC's        Shoulder Exercises: Prone   Retraction  Left;10 reps;AROM   tactile cues for form   Extension  AROM;Left;10 reps   with retraction    Horizontal ABduction 1  AROM;Left;10 reps   improved   Other Prone Exercises  Lt prone row to neutral with scap retraction x 10 reps,       Shoulder Exercises: Pulleys   Flexion  3 minutes    Scaption  2 minutes    Other Pulley Exercises  IR x 5 reps x 10 sec       Shoulder Exercises: Stretch   Corner Stretch  2 reps;10 seconds   trial; limited    Other Shoulder Stretches  bilat bicep shoulder stretch x 2 reps of 20 sec       Vasopneumatic   Number Minutes Vasopneumatic   10 minutes    Vasopnuematic Location   Shoulder   Lt   Vasopneumatic Pressure  Low    Vasopneumatic Temperature   34      Manual Therapy   Soft tissue mobilization  Lt pec,  bicep, deltoid, lat     Myofascial Release  Lt pec release     Passive ROM  Lt arm into scaption and ER                PT Short Term Goals - 11/20/19 1642      PT SHORT TERM GOAL #1   Title  The patient will be indep with HEP for pendulum, wrist flexion/extension, AAROM when indicated.    Time  6    Period  Weeks    Status  Achieved    Target Date  12/23/19      PT SHORT TERM GOAL #2   Title  The patient will improve AROM to full elbow AROM.    Time  6    Period  Weeks    Status  Achieved    Target Date  12/23/19      PT SHORT TERM GOAL #3   Title  The patient will have AROM L shoulder to 150 degrees flexion, 90 degrees abduction, 30 degrees ER to demonstrate improving AROM.    Baseline  PROM only performed today.    Time  6    Period  Weeks    Status  On-going    Target Date  12/23/19        PT Long Term Goals - 11/09/19 1334      PT LONG TERM GOAL #1   Title  The patient will be indep with progression of HEP.    Time  12    Period  Weeks    Target Date  01/31/20      PT LONG TERM GOAL #2   Title  Improve FOTO to < or equal to 50% limited to demo improved function.     Time  12    Period  Weeks    Target Date  01/31/20      PT LONG TERM GOAL #3   Title  The patient will demonstrate functional AROM ER and IR L shoulder for improved IADL performance.    Time  12    Period  Weeks    Target Date  01/31/20      PT LONG TERM GOAL #4   Title  The patient will perform reaching across his body without pain to demo improved functional use of the L UE.    Time  12    Period  Weeks    Target Date  01/31/20            Plan - 12/11/19 1422    Clinical Impression Statement  Improved ability to complete prone shoulder AROM exercises.  Continued limited Lt shoulder ER ROM (40 deg passive).  Pt progressing well within rehab protocol.    Examination-Activity Limitations  Reach Overhead;Lift;Sleep    Stability/Clinical Decision Making  Stable/Uncomplicated    Rehab Potential  Good    PT Frequency  3x / week    PT Duration  12 weeks    PT Treatment/Interventions  ADLs/Self Care Home Management;Cryotherapy;Electrical Stimulation;Ultrasound;Moist Heat;Therapeutic activities;Therapeutic exercise;Patient/family education;Neuromuscular re-education;Manual techniques;Vasopneumatic Device;Passive range of motion    PT Next Visit Plan  continue progressive Lt shoulder ROM/light strengthening per protocol.    PT Home Exercise Plan  Access Code: [email or web address]    Consulted and Agree with Plan of Care  Patient       Patient will benefit from skilled therapeutic intervention in order to improve the following deficits and impairments:  Pain, Increased edema, Decreased range of motion, Increased fascial  restricitons, Impaired UE functional use, Impaired flexibility, Improper body mechanics, Postural dysfunction  Visit Diagnosis: Stiffness of left shoulder, not elsewhere classified  Acute pain of left shoulder  Localized edema  Abnormal posture     Problem List Patient Active Problem List   Diagnosis Date Noted  . Tendinitis of long head of biceps brachii of left  shoulder 09/29/2019  . Subacromial bursitis of left shoulder joint 09/29/2019  . Ganglion cyst 11/03/2013  . Hiatal hernia 11/11/2012  . FATIGUE 08/08/2010  . PSA, INCREASED 08/08/2010  . IFG (impaired fasting glucose) 09/26/2007  . HYPERCHOLESTEROLEMIA, PURE 07/12/2007  . ABNORMAL FINDINGS, ELEVATED BP W/O HTN 07/12/2007   Mayer CamelJennifer Carlson-Long, PTA 12/11/19 2:29 PM  [facility] [address] [place] [address] [place], [place], [address] Phone: [phone]   Fax:  [phone]  Name: Sharlette DenseFernando Barb MRN: [id number] Date of Birth: 01-09-1966

## 2019-12-12 ENCOUNTER — Other Ambulatory Visit: Payer: Self-pay

## 2019-12-12 ENCOUNTER — Ambulatory Visit (INDEPENDENT_AMBULATORY_CARE_PROVIDER_SITE_OTHER): Payer: 59 | Admitting: Physical Therapy

## 2019-12-12 DIAGNOSIS — M25512 Pain in left shoulder: Secondary | ICD-10-CM

## 2019-12-12 DIAGNOSIS — M25612 Stiffness of left shoulder, not elsewhere classified: Secondary | ICD-10-CM

## 2019-12-12 DIAGNOSIS — R600 Localized edema: Secondary | ICD-10-CM

## 2019-12-12 DIAGNOSIS — R293 Abnormal posture: Secondary | ICD-10-CM

## 2019-12-12 NOTE — Therapy (Signed)
[place], [place], [address] Phone: [phone]   Fax:  [phone]  Physical Therapy Treatment  Patient Details  Name: George Young MRN: [id number] Date of Birth: [birth year] Referring Provider (PT): Hiram Gash, MD   Encounter Date: 12/12/2019  PT End of Session - 12/12/19 0842    Visit Number  13    Number of Visits  24    Date for PT Re-Evaluation  02/06/20    Authorization Type  aetna    Authorization - Number of Visits  60    PT Start Time  0801    PT Stop Time  3299    PT Time Calculation (min)  48 min    Activity Tolerance  Patient tolerated treatment well;No increased pain    Behavior During Therapy  WFL for tasks assessed/performed       Past Medical History:  Diagnosis Date  . Hiatal hernia   . Hyperlipemia   . Wrist fracture 2010    Past Surgical History:  Procedure Laterality Date  . ESOPHAGOGASTRODUODENOSCOPY    . HAIR TRANSPLANT     Dr. Eual Fines  . LASIK  08/2013   Right    There were no vitals filed for this visit.  Subjective Assessment - 12/12/19 0806    Subjective  Pt reports is was able to sleep on Lt side a little bit last night, "It doesn't feel as tight".  He notes the pain on incisions is not as bad.    Currently in Pain?  No/denies    Pain Score  0-No pain         [facility] PT Assessment - 12/12/19 0001      Assessment   Medical Diagnosis  M67.819 (ICD-10-CM) - Biceps tendonosis of shoulder; Z98.890 (ICD-10-CM) - History of bursectomy    Referring Provider (PT)  Hiram Gash, MD    Onset Date/Surgical Date  10/23/19    Hand Dominance  Right    Next MD Visit  01/23/20    Prior Therapy  had prior shoulder therapy before surgery      AROM   Right/Left Shoulder  Left    Left Shoulder Extension  45 Degrees   standing   Left Shoulder Flexion  129 Degrees   standing    Left Shoulder ABduction  120 Degrees      PROM   Left Shoulder Flexion  155  Degrees   supine, AAROM with cane   Left Shoulder External Rotation  49 Degrees   supine, scaption plane       [facility] Adult PT Treatment/Exercise - 12/12/19 0001      Shoulder Exercises: Supine   External Rotation  AAROM;Left;10 reps   with cane, 10 sec hold.    Flexion  AAROM;Left;5 reps   with cane   ABduction  AAROM;Left;5 reps   cane   Other Supine Exercises  on 1/2 foam roller:  prolonged stretch into abdct x 1.5 min, then 5 slow snow angels to tolerance.   Thoracic ext over 1/2 foam roller x 5 reps       Shoulder Exercises: Standing   Extension  AAROM;Both;5 reps   with cane     Shoulder Exercises: Pulleys   Flexion  2 minutes    Scaption  2 minutes      Shoulder Exercises: Therapy Ball   Flexion  Left    Flexion Limitations  (ball on wall, shoulder flex ~90 deg, CW/CCW); repeated at ~  110 deg.     Scaption  Left    Scaption Limitations  (ball on wall, shoulder scaption ~90 deg, CW/CCW)     Other Therapy Ball Exercises  scap depression with Lt hand on green ball x 5 sec x 10 reps, then gentle perterbations x 15 sec x 2      Vasopneumatic   Number Minutes Vasopneumatic   10 minutes    Vasopnuematic Location   Shoulder   Lt   Vasopneumatic Pressure  Low    Vasopneumatic Temperature   34               PT Short Term Goals - 11/20/19 1642      PT SHORT TERM GOAL #1   Title  The patient will be indep with HEP for pendulum, wrist flexion/extension, AAROM when indicated.    Time  6    Period  Weeks    Status  Achieved    Target Date  12/23/19      PT SHORT TERM GOAL #2   Title  The patient will improve AROM to full elbow AROM.    Time  6    Period  Weeks    Status  Achieved    Target Date  12/23/19      PT SHORT TERM GOAL #3   Title  The patient will have AROM L shoulder to 150 degrees flexion, 90 degrees abduction, 30 degrees ER to demonstrate improving AROM.    Baseline  PROM only performed today.    Time  6    Period  Weeks    Status  On-going     Target Date  12/23/19        PT Long Term Goals - 11/09/19 1334      PT LONG TERM GOAL #1   Title  The patient will be indep with progression of HEP.    Time  12    Period  Weeks    Target Date  01/31/20      PT LONG TERM GOAL #2   Title  Improve FOTO to < or equal to 50% limited to demo improved function.    Time  12    Period  Weeks    Target Date  01/31/20      PT LONG TERM GOAL #3   Title  The patient will demonstrate functional AROM ER and IR L shoulder for improved IADL performance.    Time  12    Period  Weeks    Target Date  01/31/20      PT LONG TERM GOAL #4   Title  The patient will perform reaching across his body without pain to demo improved functional use of the L UE.    Time  12    Period  Weeks    Target Date  01/31/20            Plan - 12/12/19 0840    Clinical Impression Statement  Pt demonstrated improved Lt shoulder ROM; progressing towards ROM goals.  He tolerated all exercises well, with min increase in discomfort.  PRogressing well within rehab protocol.    Examination-Activity Limitations  Reach Overhead;Lift;Sleep    Stability/Clinical Decision Making  Stable/Uncomplicated    Rehab Potential  Good    PT Frequency  3x / week    PT Duration  12 weeks    PT Treatment/Interventions  ADLs/Self Care Home Management;Cryotherapy;Electrical Stimulation;Ultrasound;Moist Heat;Therapeutic activities;Therapeutic exercise;Patient/family education;Neuromuscular re-education;Manual techniques;Vasopneumatic Device;Passive range of motion  PT Next Visit Plan  continue progressive Lt shoulder ROM/light strengthening per protocol. begin light band work if tolerated.    PT Home Exercise Plan  Access Code: [email or web address]    Consulted and Agree with Plan of Care  Patient       Patient will benefit from skilled therapeutic intervention in order to improve the following deficits and impairments:  Pain, Increased edema, Decreased range of motion, Increased fascial  restricitons, Impaired UE functional use, Impaired flexibility, Improper body mechanics, Postural dysfunction  Visit Diagnosis: Stiffness of left shoulder, not elsewhere classified  Acute pain of left shoulder  Localized edema  Abnormal posture     Problem List Patient Active Problem List   Diagnosis Date Noted  . Tendinitis of long head of biceps brachii of left shoulder 09/29/2019  . Subacromial bursitis of left shoulder joint 09/29/2019  . Ganglion cyst 11/03/2013  . Hiatal hernia 11/11/2012  . FATIGUE 08/08/2010  . PSA, INCREASED 08/08/2010  . IFG (impaired fasting glucose) 09/26/2007  . HYPERCHOLESTEROLEMIA, PURE 07/12/2007  . ABNORMAL FINDINGS, ELEVATED BP W/O HTN 07/12/2007   Mayer CamelJennifer Carlson-Long, PTA 12/12/19 8:46 AM  [facility] [address] [place] [address] [place], [place], [address] Phone: [phone]   Fax:  [phone]  Name: George Young MRN: [id number] Date of Birth: 07-05-1966

## 2019-12-14 ENCOUNTER — Encounter: Payer: Self-pay | Admitting: Physical Therapy

## 2019-12-15 ENCOUNTER — Other Ambulatory Visit: Payer: Self-pay

## 2019-12-15 ENCOUNTER — Ambulatory Visit (INDEPENDENT_AMBULATORY_CARE_PROVIDER_SITE_OTHER): Payer: 59 | Admitting: Physical Therapy

## 2019-12-15 DIAGNOSIS — R600 Localized edema: Secondary | ICD-10-CM

## 2019-12-15 DIAGNOSIS — M25612 Stiffness of left shoulder, not elsewhere classified: Secondary | ICD-10-CM | POA: Diagnosis not present

## 2019-12-15 DIAGNOSIS — M25512 Pain in left shoulder: Secondary | ICD-10-CM | POA: Diagnosis not present

## 2019-12-15 DIAGNOSIS — R293 Abnormal posture: Secondary | ICD-10-CM | POA: Diagnosis not present

## 2019-12-15 NOTE — Patient Instructions (Signed)
Resisted External Rotation: in Neutral - Bilateral Resistive Band Rowing   With resistive band anchored in door, grasp both ends. Keeping elbows bent, pull back, squeezing shoulder blades together. Hold _3-5___ seconds. Repeat _10-30___ times. Do __1__ sessions per day.   Strengthening: Resisted Extension   Hold tubing with both hands, arms forward. Pull arms back, elbow straight. Repeat _10-30___ times per set. Do __1__ sets per session. Do _1___ sessions per day.

## 2019-12-15 NOTE — Therapy (Signed)
[place], [place], [address] Phone: [phone]   Fax:  [phone]  Physical Therapy Treatment  Patient Details  Name: George Young MRN: [id number] Date of Birth: 1966-09-21 Referring Provider (PT): Hiram Gash, MD   Encounter Date: 12/15/2019  PT End of Session - 12/15/19 0849    Visit Number  14    Number of Visits  24    Date for PT Re-Evaluation  02/06/20    Authorization Type  aetna    Authorization - Number of Visits  60    PT Start Time  0845    PT Stop Time  0930    PT Time Calculation (min)  45 min    Activity Tolerance  Patient tolerated treatment well;No increased pain    Behavior During Therapy  WFL for tasks assessed/performed       Past Medical History:  Diagnosis Date  . Hiatal hernia   . Hyperlipemia   . Wrist fracture 2010    Past Surgical History:  Procedure Laterality Date  . ESOPHAGOGASTRODUODENOSCOPY    . HAIR TRANSPLANT     Dr. Eual Fines  . LASIK  08/2013   Right    There were no vitals filed for this visit.  Subjective Assessment - 12/15/19 0851    Subjective  Pt reports he continues to have 2/10 pain with using Lt arm, but no pain at rest.  Nothing new to report since last visit.    Currently in Pain?  No/denies    Pain Score  0-No pain         [facility] PT Assessment - 12/15/19 0001      Assessment   Medical Diagnosis  M67.819 (ICD-10-CM) - Biceps tendonosis of shoulder; Z98.890 (ICD-10-CM) - History of bursectomy    Referring Provider (PT)  Hiram Gash, MD    Onset Date/Surgical Date  10/23/19    Hand Dominance  Right    Next MD Visit  01/23/20    Prior Therapy  had prior shoulder therapy before surgery      PROM   Left Shoulder External Rotation  50 Degrees   supine, scaption plane       [facility] Adult PT Treatment/Exercise - 12/15/19 0001      Shoulder Exercises: Supine   External Rotation  AAROM;Left;10 reps   with cane, 10 sec  hold.    Flexion  AAROM;Left;5 reps   with cane   ABduction  AAROM;Left;5 reps   cane   Other Supine Exercises  on full foam roller:  prolonged stretch into abdct x 1 min, then 5 slow snow angels to tolerance.   Thoracic ext over 1/2 foam roller x 5 reps       Shoulder Exercises: Standing   External Rotation  Strengthening;Both;10 reps    Theraband Level (Shoulder External Rotation)  Level 1 (Yellow)    External Rotation Limitations  mirror for feedback of form.     Extension  Strengthening;Both;10 reps    Theraband Level (Shoulder Extension)  Level 1 (Yellow)    Row  Both;10 reps   3 sec hold in retraction   Theraband Level (Shoulder Row)  Level 1 (Yellow)    Other Standing Exercises  wall ladder in scaption and flexion x 2 reps to #26 with LUE.       Shoulder Exercises: Pulleys   Flexion  --   10 reps, holding 10 sec   Scaption  --   10 reps,  holding 10 sec   Other Pulley Exercises  IR x 5 reps x 10 sec       Vasopneumatic   Number Minutes Vasopneumatic   10 minutes    Vasopnuematic Location   Shoulder   Lt   Vasopneumatic Pressure  Low    Vasopneumatic Temperature   34             PT Education - 12/15/19 0912    Education Details  issued yellow band; HEP    Person(s) Educated  Patient    Methods  Explanation;Demonstration;Handout;Verbal cues    Comprehension  Verbalized understanding;Returned demonstration       PT Short Term Goals - 12/15/19 0851      PT SHORT TERM GOAL #1   Title  The patient will be indep with HEP for pendulum, wrist flexion/extension, AAROM when indicated.    Time  6    Period  Weeks    Status  Achieved    Target Date  12/23/19      PT SHORT TERM GOAL #2   Title  The patient will improve AROM to full elbow AROM.    Time  6    Period  Weeks    Status  Achieved    Target Date  12/23/19      PT SHORT TERM GOAL #3   Title  The patient will have AROM L shoulder to 150 degrees flexion, 90 degrees abduction, 30 degrees ER to  demonstrate improving AROM.    Baseline  PROM only performed today.    Time  6    Period  Weeks    Status  Partially Met    Target Date  12/23/19        PT Long Term Goals - 11/09/19 1334      PT LONG TERM GOAL #1   Title  The patient will be indep with progression of HEP.    Time  12    Period  Weeks    Target Date  01/31/20      PT LONG TERM GOAL #2   Title  Improve FOTO to < or equal to 50% limited to demo improved function.    Time  12    Period  Weeks    Target Date  01/31/20      PT LONG TERM GOAL #3   Title  The patient will demonstrate functional AROM ER and IR L shoulder for improved IADL performance.    Time  12    Period  Weeks    Target Date  01/31/20      PT LONG TERM GOAL #4   Title  The patient will perform reaching across his body without pain to demo improved functional use of the L UE.    Time  12    Period  Weeks    Target Date  01/31/20            Plan - 12/15/19 0856    Clinical Impression Statement  Pt has partially met STG #3 with improved Lt shoulder AROM; Lt shoulder ER has progressed 10 degrees this week.  Pt able to tolerate all new exercises well, without any increase in pain; only fatigue.    Examination-Activity Limitations  Reach Overhead;Lift;Sleep    Stability/Clinical Decision Making  Stable/Uncomplicated    Rehab Potential  Good    PT Frequency  3x / week    PT Duration  12 weeks    PT Treatment/Interventions  ADLs/Self Care Home  Management;Cryotherapy;Electrical Stimulation;Ultrasound;Moist Heat;Therapeutic activities;Therapeutic exercise;Patient/family education;Neuromuscular re-education;Manual techniques;Vasopneumatic Device;Passive range of motion    PT Next Visit Plan  continue progressive Lt shoulder ROM/light strengthening per protocol.    PT Home Exercise Plan  Access Code: [email or web address]    Consulted and Agree with Plan of Care  Patient       Patient will benefit from skilled therapeutic intervention in order to improve  the following deficits and impairments:  Pain, Increased edema, Decreased range of motion, Increased fascial restricitons, Impaired UE functional use, Impaired flexibility, Improper body mechanics, Postural dysfunction  Visit Diagnosis: Stiffness of left shoulder, not elsewhere classified  Acute pain of left shoulder  Localized edema  Abnormal posture     Problem List Patient Active Problem List   Diagnosis Date Noted  . Tendinitis of long head of biceps brachii of left shoulder 09/29/2019  . Subacromial bursitis of left shoulder joint 09/29/2019  . Ganglion cyst 11/03/2013  . Hiatal hernia 11/11/2012  . FATIGUE 08/08/2010  . PSA, INCREASED 08/08/2010  . IFG (impaired fasting glucose) 09/26/2007  . HYPERCHOLESTEROLEMIA, PURE 07/12/2007  . ABNORMAL FINDINGS, ELEVATED BP W/O HTN 07/12/2007   Kerin Perna, PTA 12/15/19 9:30 AM  [facility] [place], [place], [address] Phone: [phone]   Fax:  [phone]  Name: George Young MRN: [id number] Date of Birth: 1966/03/20

## 2019-12-18 ENCOUNTER — Encounter: Payer: Self-pay | Admitting: Rehabilitative and Restorative Service Providers"

## 2019-12-18 ENCOUNTER — Ambulatory Visit (INDEPENDENT_AMBULATORY_CARE_PROVIDER_SITE_OTHER): Payer: 59 | Admitting: Rehabilitative and Restorative Service Providers"

## 2019-12-18 ENCOUNTER — Other Ambulatory Visit: Payer: Self-pay

## 2019-12-18 DIAGNOSIS — M25612 Stiffness of left shoulder, not elsewhere classified: Secondary | ICD-10-CM

## 2019-12-18 DIAGNOSIS — R293 Abnormal posture: Secondary | ICD-10-CM | POA: Diagnosis not present

## 2019-12-18 DIAGNOSIS — M6281 Muscle weakness (generalized): Secondary | ICD-10-CM

## 2019-12-18 DIAGNOSIS — M25512 Pain in left shoulder: Secondary | ICD-10-CM

## 2019-12-18 DIAGNOSIS — R29898 Other symptoms and signs involving the musculoskeletal system: Secondary | ICD-10-CM

## 2019-12-18 DIAGNOSIS — R600 Localized edema: Secondary | ICD-10-CM | POA: Diagnosis not present

## 2019-12-18 NOTE — Therapy (Signed)
[place], [place], [address] Phone: [phone]   Fax:  [phone]  Physical Therapy Treatment  Patient Details  Name: George Young MRN: [id number] Date of Birth: Apr 22, 1966 Referring Provider (PT): Hiram Gash, MD   Encounter Date: 12/18/2019  PT End of Session - 12/18/19 0856    Visit Number  15    Number of Visits  24    Date for PT Re-Evaluation  02/06/20    Authorization Type  aetna    Authorization - Number of Visits  60    PT Start Time  0800    PT Stop Time  0848    PT Time Calculation (min)  48 min    Activity Tolerance  Patient tolerated treatment well;No increased pain    Behavior During Therapy  WFL for tasks assessed/performed       Past Medical History:  Diagnosis Date  . Hiatal hernia   . Hyperlipemia   . Wrist fracture 2010    Past Surgical History:  Procedure Laterality Date  . ESOPHAGOGASTRODUODENOSCOPY    . HAIR TRANSPLANT     Dr. Eual Fines  . LASIK  08/2013   Right    There were no vitals filed for this visit.  Subjective Assessment - 12/18/19 0759    Subjective  The patient rates pain 3/10 today noting he didn't sleep well last night and scapular pain woke him t/o the night.    Pertinent History  has R arm tennis elbow, hypercholesterolemia, elevated sugar (A1C was 6 last time)    Patient Stated Goals  Be able to use the left arm as well as prior function.    Currently in Pain?  Yes    Pain Score  3     Pain Location  Shoulder    Pain Orientation  Left    Pain Descriptors / Indicators  Sore    Pain Type  Acute pain;Surgical pain    Pain Onset  More than a month ago    Pain Frequency  Intermittent    Aggravating Factors   sleeping, rolling onto shoulder    Pain Relieving Factors  ice                       [facility] Adult PT Treatment/Exercise - 12/18/19 0804      Exercises   Exercises  Shoulder      Shoulder Exercises: Supine    External Rotation  AROM;PROM;Left;10 reps    Flexion  AROM;Left;10 reps    Flexion Limitations  contract relax at 90 degrees (2 fingers for submaximal effort)    ABduction  AROM;Left    ABduction Limitations  moving from 90 degrees flexion into abduction      Shoulder Exercises: Seated   Retraction  AROM;Strengthening;Left;10 reps    Retraction Limitations  with tactile cues for scapular engagement    Other Seated Exercises  shoulder shrugs x 10 reps    Other Seated Exercises  scapular depression into physioball with tactile cues for depression      Shoulder Exercises: Prone   Retraction  Strengthening;Both;10 reps    Extension  Strengthening;Left;10 reps    Extension Limitations  with tactile cues for scapular retraction      Shoulder Exercises: Standing   External Rotation  5 reps;Left;AROM    External Rotation Limitations  mirror for feedback    Internal Rotation  Strengthening;Both;10 reps    Internal Rotation Limitations  lifting  cane off hips    Flexion  Left;AROM;Strengthening;10 reps    Flexion Limitations  tactile cues for scapular stabilization    ABduction  AROM;Strengthening;10 reps    ABduction Limitations  wiht tactile cues for scapular stabilization    Other Standing Exercises  wall ladder 10 times in flexion and  and scaption with tactile cues scaption.  Flexion up to 27 and scaption to 26      Shoulder Exercises: Pulleys   Flexion  2 minutes   holding 10 seconds at end range   Scaption  1 minute   holding 10 seconds at end range     Vasopneumatic   Number Minutes Vasopneumatic   10 minutes    Vasopnuematic Location   Shoulder    Vasopneumatic Pressure  Low    Vasopneumatic Temperature   34      Manual Therapy   Manual Therapy  Joint mobilization;Soft tissue mobilization;Taping;Passive ROM;Scapular mobilization    Manual therapy comments  Supine and sidelying; taping 2 I strips L shoulder for supraspinatus support    Joint Mobilization  grade I to relax  during PROM    Soft tissue mobilization  STM to subscapularis, supraspinatus,     Scapular Mobilization  sidelying PROM scapular for relaxation    Passive ROM  stretching into flexion, ER, abduction               PT Short Term Goals - 12/15/19 1610      PT SHORT TERM GOAL #1   Title  The patient will be indep with HEP for pendulum, wrist flexion/extension, AAROM when indicated.    Time  6    Period  Weeks    Status  Achieved    Target Date  12/23/19      PT SHORT TERM GOAL #2   Title  The patient will improve AROM to full elbow AROM.    Time  6    Period  Weeks    Status  Achieved    Target Date  12/23/19      PT SHORT TERM GOAL #3   Title  The patient will have AROM L shoulder to 150 degrees flexion, 90 degrees abduction, 30 degrees ER to demonstrate improving AROM.    Baseline  PROM only performed today.    Time  6    Period  Weeks    Status  Partially Met    Target Date  12/23/19        PT Long Term Goals - 11/09/19 1334      PT LONG TERM GOAL #1   Title  The patient will be indep with progression of HEP.    Time  12    Period  Weeks    Target Date  01/31/20      PT LONG TERM GOAL #2   Title  Improve FOTO to < or equal to 50% limited to demo improved function.    Time  12    Period  Weeks    Target Date  01/31/20      PT LONG TERM GOAL #3   Title  The patient will demonstrate functional AROM ER and IR L shoulder for improved IADL performance.    Time  12    Period  Weeks    Target Date  01/31/20      PT LONG TERM GOAL #4   Title  The patient will perform reaching across his body without pain to demo improved functional use of the  L UE.    Time  12    Period  Weeks    Target Date  01/31/20            Plan - 12/18/19 0857    Clinical Impression Statement  The patient had increased pain upon arrival today reporting 3/10 pain resolved at end of session.  He is compensating with shoulder elevation, which may be contributing to increaed pain in  upper trap and supraspinatus.  PT continuing to progress *may consider reduction to 2x/week since patient able to perform more via HEP at this time.    PT Treatment/Interventions  ADLs/Self Care Home Management;Cryotherapy;Electrical Stimulation;Ultrasound;Moist Heat;Therapeutic activities;Therapeutic exercise;Patient/family education;Neuromuscular re-education;Manual techniques;Vasopneumatic Device;Passive range of motion    PT Next Visit Plan  *Discuss reduced to 2x/week; continue progressive Lt shoulder ROM/light strengthening per protocol.    PT Home Exercise Plan  Access Code: [email or web address]    Consulted and Agree with Plan of Care  Patient       Patient will benefit from skilled therapeutic intervention in order to improve the following deficits and impairments:  Pain, Increased edema, Decreased range of motion, Increased fascial restricitons, Impaired UE functional use, Impaired flexibility, Improper body mechanics, Postural dysfunction  Visit Diagnosis: No diagnosis found.     Problem List Patient Active Problem List   Diagnosis Date Noted  . Tendinitis of long head of biceps brachii of left shoulder 09/29/2019  . Subacromial bursitis of left shoulder joint 09/29/2019  . Ganglion cyst 11/03/2013  . Hiatal hernia 11/11/2012  . FATIGUE 08/08/2010  . PSA, INCREASED 08/08/2010  . IFG (impaired fasting glucose) 09/26/2007  . HYPERCHOLESTEROLEMIA, PURE 07/12/2007  . ABNORMAL FINDINGS, ELEVATED BP W/O HTN 07/12/2007    [place] , PT 12/18/2019, 9:00 AM  [facility] [place], [place], [address] Phone: [phone]   Fax:  [phone]  Name: Armonie Staten MRN: [id number] Date of Birth: March 23, 1966

## 2019-12-19 ENCOUNTER — Encounter: Payer: Self-pay | Admitting: Rehabilitative and Restorative Service Providers"

## 2019-12-19 ENCOUNTER — Ambulatory Visit (INDEPENDENT_AMBULATORY_CARE_PROVIDER_SITE_OTHER): Payer: 59 | Admitting: Rehabilitative and Restorative Service Providers"

## 2019-12-19 DIAGNOSIS — M25612 Stiffness of left shoulder, not elsewhere classified: Secondary | ICD-10-CM | POA: Diagnosis not present

## 2019-12-19 DIAGNOSIS — R293 Abnormal posture: Secondary | ICD-10-CM | POA: Diagnosis not present

## 2019-12-19 DIAGNOSIS — M25512 Pain in left shoulder: Secondary | ICD-10-CM | POA: Diagnosis not present

## 2019-12-19 DIAGNOSIS — R600 Localized edema: Secondary | ICD-10-CM | POA: Diagnosis not present

## 2019-12-19 DIAGNOSIS — M6281 Muscle weakness (generalized): Secondary | ICD-10-CM

## 2019-12-19 DIAGNOSIS — R29898 Other symptoms and signs involving the musculoskeletal system: Secondary | ICD-10-CM

## 2019-12-19 NOTE — Therapy (Signed)
[place], [place], [address] Phone: [phone]   Fax:  [phone]  Physical Therapy Treatment  Patient Details  Name: George Young MRN: [id number] Date of Birth: Jan 30, 1966 Referring Provider (PT): Hiram Gash, MD   Encounter Date: 12/19/2019  PT End of Session - 12/19/19 0811    Visit Number  16    Number of Visits  24    Date for PT Re-Evaluation  02/06/20    Authorization Type  aetna    Authorization - Number of Visits  101    PT Start Time  0807    PT Stop Time  0850    PT Time Calculation (min)  43 min    Activity Tolerance  Patient tolerated treatment well;No increased pain    Behavior During Therapy  WFL for tasks assessed/performed       Past Medical History:  Diagnosis Date  . Hiatal hernia   . Hyperlipemia   . Wrist fracture 2010    Past Surgical History:  Procedure Laterality Date  . ESOPHAGOGASTRODUODENOSCOPY    . HAIR TRANSPLANT     Dr. Eual Fines  . LASIK  08/2013   Right    There were no vitals filed for this visit.  Subjective Assessment - 12/19/19 0807    Subjective  The patient arrives 5 minutes late today.  He feels yesterday's treatment  helped reduce pain and he slept well last night.    Pertinent History  has R arm tennis elbow, hypercholesterolemia, elevated sugar (A1C was 6 last time)    Patient Stated Goals  Be able to use the left arm as well as prior function.    Currently in Pain?  No/denies         [facility] PT Assessment - 12/19/19 0830      AROM   Left Shoulder Flexion  136 Degrees   in standing   Left Shoulder External Rotation  45 Degrees   supine abduction to 45 deg     PROM   Left Shoulder External Rotation  50 Degrees   supine scaption plane                  [facility] Adult PT Treatment/Exercise - 12/19/19 0814      Exercises   Exercises  Shoulder      Shoulder Exercises: Supine   Other Supine Exercises  thoracic  self mobilization for chest opening with towel roll stretch with arms in "T" and "Y" to tolerance.      Shoulder Exercises: Seated   Other Seated Exercises  Seated shoulder extension (band over door and pulling down with assistance).  Used red band.        Shoulder Exercises: Sidelying   External Rotation  Strengthening;Left;12 reps    ABduction  AROM;Left;5 reps      Shoulder Exercises: Standing   External Rotation  Strengthening;Left;12 reps    Theraband Level (Shoulder External Rotation)  Level 1 (Yellow)    Internal Rotation  Strengthening;Left;12 reps    Theraband Level (Shoulder Internal Rotation)  Level 1 (Yellow)    Flexion  Left;AROM;Strengthening;10 reps    Flexion Limitations  tactile cues for scapular stabilization    ABduction  AROM;Strengthening;Left;12 reps    Extension  Strengthening;Left;12 reps    Theraband Level (Shoulder Extension)  Level 1 (Yellow)      Shoulder Exercises: Pulleys   Flexion  1 minute    Scaption  1 minute  Other Pulley Exercises  held x 10 seconds at end range for stretch      Shoulder Exercises: Therapy Ball   Flexion  Both;10 reps    Flexion Limitations  rolling physioball on mat for stretch    ABduction  Left;10 reps    ABduction Limitations  rolling physioball on mat for stretch      Vasopneumatic   Number Minutes Vasopneumatic   10 minutes    Vasopnuematic Location   Shoulder    Vasopneumatic Pressure  Low    Vasopneumatic Temperature   34               PT Short Term Goals - 12/15/19 0851      PT SHORT TERM GOAL #1   Title  The patient will be indep with HEP for pendulum, wrist flexion/extension, AAROM when indicated.    Time  6    Period  Weeks    Status  Achieved    Target Date  12/23/19      PT SHORT TERM GOAL #2   Title  The patient will improve AROM to full elbow AROM.    Time  6    Period  Weeks    Status  Achieved    Target Date  12/23/19      PT SHORT TERM GOAL #3   Title  The patient will have AROM L  shoulder to 150 degrees flexion, 90 degrees abduction, 30 degrees ER to demonstrate improving AROM.    Baseline  PROM only performed today.    Time  6    Period  Weeks    Status  Partially Met    Target Date  12/23/19        PT Long Term Goals - 11/09/19 1334      PT LONG TERM GOAL #1   Title  The patient will be indep with progression of HEP.    Time  12    Period  Weeks    Target Date  01/31/20      PT LONG TERM GOAL #2   Title  Improve FOTO to < or equal to 50% limited to demo improved function.    Time  12    Period  Weeks    Target Date  01/31/20      PT LONG TERM GOAL #3   Title  The patient will demonstrate functional AROM ER and IR L shoulder for improved IADL performance.    Time  12    Period  Weeks    Target Date  01/31/20      PT LONG TERM GOAL #4   Title  The patient will perform reaching across his body without pain to demo improved functional use of the L UE.    Time  12    Period  Weeks    Target Date  01/31/20            Plan - 12/19/19 0844    Clinical Impression Statement  The patient felt improvement today after therapy yesterday.  He tolerated progression of resistance training with theraband for ER/IR, shoulder extension.  PT to continue progressing according to protocol.    PT Treatment/Interventions  ADLs/Self Care Home Management;Cryotherapy;Electrical Stimulation;Ultrasound;Moist Heat;Therapeutic activities;Therapeutic exercise;Patient/family education;Neuromuscular re-education;Manual techniques;Vasopneumatic Device;Passive range of motion    PT Next Visit Plan  2x/week, continue progressive L shoulder ROM, light strengthening per protocol.  AT 8 weeks should be able to introduce cross body adduction.    PT Home Exercise  Plan  Access Code: [email or web address]    Consulted and Agree with Plan of Care  Patient       Patient will benefit from skilled therapeutic intervention in order to improve the following deficits and impairments:  Pain, Increased  edema, Decreased range of motion, Increased fascial restricitons, Impaired UE functional use, Impaired flexibility, Improper body mechanics, Postural dysfunction  Visit Diagnosis: Stiffness of left shoulder, not elsewhere classified  Acute pain of left shoulder  Localized edema  Abnormal posture  Other symptoms and signs involving the musculoskeletal system  Muscle weakness (generalized)     Problem List Patient Active Problem List   Diagnosis Date Noted  . Tendinitis of long head of biceps brachii of left shoulder 09/29/2019  . Subacromial bursitis of left shoulder joint 09/29/2019  . Ganglion cyst 11/03/2013  . Hiatal hernia 11/11/2012  . FATIGUE 08/08/2010  . PSA, INCREASED 08/08/2010  . IFG (impaired fasting glucose) 09/26/2007  . HYPERCHOLESTEROLEMIA, PURE 07/12/2007  . ABNORMAL FINDINGS, ELEVATED BP W/O HTN 07/12/2007    Landon Truax, PT 12/19/2019, 8:45 AM  [facility] [place], [place], [address] Phone: [phone]   Fax:  [phone]  Name: George Young MRN: [id number] Date of Birth: 1966-07-23

## 2019-12-21 ENCOUNTER — Encounter: Payer: Self-pay | Admitting: Physical Therapy

## 2019-12-25 ENCOUNTER — Encounter: Payer: Self-pay | Admitting: Rehabilitative and Restorative Service Providers"

## 2019-12-25 ENCOUNTER — Other Ambulatory Visit: Payer: Self-pay

## 2019-12-25 ENCOUNTER — Ambulatory Visit (INDEPENDENT_AMBULATORY_CARE_PROVIDER_SITE_OTHER): Payer: 59 | Admitting: Rehabilitative and Restorative Service Providers"

## 2019-12-25 ENCOUNTER — Emergency Department (INDEPENDENT_AMBULATORY_CARE_PROVIDER_SITE_OTHER)
Admission: EM | Admit: 2019-12-25 | Discharge: 2019-12-25 | Disposition: A | Discharge status: Home / Self Care | Payer: 59 | Source: Home / Self Care

## 2019-12-25 DIAGNOSIS — M25512 Pain in left shoulder: Secondary | ICD-10-CM | POA: Diagnosis not present

## 2019-12-25 DIAGNOSIS — M25612 Stiffness of left shoulder, not elsewhere classified: Secondary | ICD-10-CM | POA: Diagnosis not present

## 2019-12-25 DIAGNOSIS — J029 Acute pharyngitis, unspecified: Secondary | ICD-10-CM | POA: Diagnosis not present

## 2019-12-25 DIAGNOSIS — R600 Localized edema: Secondary | ICD-10-CM

## 2019-12-25 DIAGNOSIS — R293 Abnormal posture: Secondary | ICD-10-CM

## 2019-12-25 DIAGNOSIS — M6281 Muscle weakness (generalized): Secondary | ICD-10-CM

## 2019-12-25 DIAGNOSIS — R29898 Other symptoms and signs involving the musculoskeletal system: Secondary | ICD-10-CM

## 2019-12-25 LAB — POCT RAPID STREP A (OFFICE): Rapid Strep A Screen: NEGATIVE

## 2019-12-25 NOTE — Patient Instructions (Signed)
Access Code: [email or web address]  URL: [email or web address]  Date: 12/25/2019  Prepared by: Rudell Cobb   Exercises Seated Shoulder External Rotation AAROM with Cane and Hand in Neutral - 10 reps - 1 sets - 5 hold - 2x daily - 7x weekly Isometric Shoulder Flexion at Wall - 10 reps - 2 sets - 5-10 hold - 2x daily - 7x weekly Isometric Shoulder Abduction at Wall - 10 reps - 2 sets - 5-10 hold - 2x daily - 7x weekly Standing Isometric Shoulder Extension at Table - 10 reps - 2 sets - 5-10 hold - 2x daily - 7x weekly Split Stance Shoulder Row with Resistance - 10 reps - 1 sets - 2x daily - 7x weekly Single Arm Shoulder Extension with Resistance - 10 reps - 1 sets - 2x daily - 7x weekly Shoulder External Rotation with Anchored Resistance - 10 reps - 1 sets - 2x daily - 7x weekly Single Arm Doorway Pec Stretch at 90 Degrees Abduction - 10 reps - 1 sets - 2x daily - 7x weekly

## 2019-12-25 NOTE — Discharge Instructions (Addendum)
Continue the claritin antihistamine and Vitamin D3

## 2019-12-25 NOTE — Therapy (Signed)
[place], [place], [address] Phone: [phone]   Fax:  [phone]  Physical Therapy Treatment  Patient Details  Name: Weston Kallman MRN: [id number] Date of Birth: 12/08/1966 Referring Provider (PT): Hiram Gash, MD   Encounter Date: 12/25/2019  PT End of Session - 12/25/19 0850    Visit Number  17    Number of Visits  24    Date for PT Re-Evaluation  02/06/20    Authorization Type  aetna    Authorization - Number of Visits  66    PT Start Time  0804    PT Stop Time  0856    PT Time Calculation (min)  52 min    Activity Tolerance  Patient tolerated treatment well;No increased pain    Behavior During Therapy  WFL for tasks assessed/performed       Past Medical History:  Diagnosis Date  . Hiatal hernia   . Hyperlipemia   . Wrist fracture 2010    Past Surgical History:  Procedure Laterality Date  . ESOPHAGOGASTRODUODENOSCOPY    . HAIR TRANSPLANT     Dr. Eual Fines  . LASIK  08/2013   Right    There were no vitals filed for this visit.  Subjective Assessment - 12/25/19 0811    Subjective  The patient notes some pain at end range with reaching overhead and has been working in front of a mirror to avoid shoulder elevation.    Pertinent History  has R arm tennis elbow, hypercholesterolemia, elevated sugar (A1C was 6 last time)    Patient Stated Goals  Be able to use the left arm as well as prior function.    Currently in Pain?  No/denies         [facility] PT Assessment - 12/25/19 0820      AROM   Left Shoulder Flexion  150 Degrees   in standing, 160 in supine   Left Shoulder External Rotation  40 Degrees   shoulder scaption to 45 degrees                  [facility] Adult PT Treatment/Exercise - 12/25/19 0812      Exercises   Exercises  Shoulder      Shoulder Exercises: Supine   Other Supine Exercises  horizontal adduction x 10 reps with cane AAROM *at 8 weeks and  now able to do cross body adduction*      Shoulder Exercises: Prone   Retraction  Strengthening;Left;10 reps    Retraction Limitations  arm at 90 degrees    Flexion  Strengthening;Left;10 reps    Extension  Strengthening;Left;10 reps    External Rotation  Strengthening;Right    External Rotation Limitations  arm supported by PT at 90 degrees abduction    Other Prone Exercises  prone row x 8 reps with discomfort over anterior shoulder       Shoulder Exercises: Sidelying   External Rotation  Strengthening;10 reps;AROM;Left      Shoulder Exercises: Standing   External Rotation  Strengthening;10 reps;Left    Theraband Level (Shoulder External Rotation)  Level 2 (Red)    External Rotation Limitations  at neutral    Internal Rotation  Strengthening;Left    Theraband Level (Shoulder Internal Rotation)  Level 2 (Red)    Internal Rotation Limitations  performed sidestepping keeping elbow at 90 maintaining resistance/position against red t-band    Flexion  Left    Other Standing Exercises  W  x 10, flexion x 10,       Shoulder Exercises: Pulleys   Flexion  1 minute    Scaption  1 minute    Other Pulley Exercises  10 second holds for stretch at end range      Shoulder Exercises: Stretch   External Rotation Stretch  3 reps;30 seconds    Other Shoulder Stretches  Physioball rolling into flexion and abduction in between exercises.      Vasopneumatic   Number Minutes Vasopneumatic   10 minutes    Vasopnuematic Location   Shoulder    Vasopneumatic Pressure  Low    Vasopneumatic Temperature   34             PT Education - 12/25/19 0849    Education Details  updated and consolidated HEP    Person(s) Educated  Patient    Methods  Explanation;Demonstration;Handout    Comprehension  Returned demonstration;Verbalized understanding       PT Short Term Goals - 12/15/19 0851      PT SHORT TERM GOAL #1   Title  The patient will be indep with HEP for pendulum, wrist flexion/extension,  AAROM when indicated.    Time  6    Period  Weeks    Status  Achieved    Target Date  12/23/19      PT SHORT TERM GOAL #2   Title  The patient will improve AROM to full elbow AROM.    Time  6    Period  Weeks    Status  Achieved    Target Date  12/23/19      PT SHORT TERM GOAL #3   Title  The patient will have AROM L shoulder to 150 degrees flexion, 90 degrees abduction, 30 degrees ER to demonstrate improving AROM.    Baseline  PROM only performed today.    Time  6    Period  Weeks    Status  Partially Met    Target Date  12/23/19        PT Long Term Goals - 11/09/19 1334      PT LONG TERM GOAL #1   Title  The patient will be indep with progression of HEP.    Time  12    Period  Weeks    Target Date  01/31/20      PT LONG TERM GOAL #2   Title  Improve FOTO to < or equal to 50% limited to demo improved function.    Time  12    Period  Weeks    Target Date  01/31/20      PT LONG TERM GOAL #3   Title  The patient will demonstrate functional AROM ER and IR L shoulder for improved IADL performance.    Time  12    Period  Weeks    Target Date  01/31/20      PT LONG TERM GOAL #4   Title  The patient will perform reaching across his body without pain to demo improved functional use of the L UE.    Time  12    Period  Weeks    Target Date  01/31/20            Plan - 12/25/19 1350    Clinical Impression Statement  The patient continues to progress with ROM and strength.  PT progressing strengthening for IR/ER at neutral and modifying HEP.  Continue to progress to protocol working to [place].  PT Treatment/Interventions  ADLs/Self Care Home Management;Cryotherapy;Electrical Stimulation;Ultrasound;Moist Heat;Therapeutic activities;Therapeutic exercise;Patient/family education;Neuromuscular re-education;Manual techniques;Vasopneumatic Device;Passive range of motion    PT Next Visit Plan  2x/week, continue progressive L shoulder ROM, light strengthening per protocol.  AT  8 weeks should be able to introduce cross body adduction.    PT Home Exercise Plan  Access Code: [email or web address]    Consulted and Agree with Plan of Care  Patient       Patient will benefit from skilled therapeutic intervention in order to improve the following deficits and impairments:  Pain, Increased edema, Decreased range of motion, Increased fascial restricitons, Impaired UE functional use, Impaired flexibility, Improper body mechanics, Postural dysfunction  Visit Diagnosis: Stiffness of left shoulder, not elsewhere classified  Acute pain of left shoulder  Localized edema  Abnormal posture  Other symptoms and signs involving the musculoskeletal system  Muscle weakness (generalized)     Problem List Patient Active Problem List   Diagnosis Date Noted  . Tendinitis of long head of biceps brachii of left shoulder 09/29/2019  . Subacromial bursitis of left shoulder joint 09/29/2019  . Ganglion cyst 11/03/2013  . Hiatal hernia 11/11/2012  . FATIGUE 08/08/2010  . PSA, INCREASED 08/08/2010  . IFG (impaired fasting glucose) 09/26/2007  . HYPERCHOLESTEROLEMIA, PURE 07/12/2007  . ABNORMAL FINDINGS, ELEVATED BP W/O HTN 07/12/2007    WEAVER,CHRISTINA,PT 12/25/2019, 1:51 PM  [facility] [place], [place], [address] Phone: [phone]   Fax:  [phone]  Name: Nephtali Docken MRN: [id number] Date of Birth: 06/29/1966

## 2019-12-25 NOTE — ED Triage Notes (Signed)
Pt c/o sore throat. Started as post nasal drainage. Mild headache yesterday. Took tylenol. Hx of season allergies. Takes allergy shots.

## 2019-12-25 NOTE — ED Provider Notes (Signed)
[facility]    CSN: [id number] Arrival date & time: 12/25/19  0911      History   Chief Complaint Chief Complaint  Patient presents with  . Sore Throat    HPI George Young is a 53 y.o. male.   Is a 53 year old established [facility] patient who presents with sore throat.  Patient has chronic allergies and has been taking allergy shots for some time.  Nevertheless yesterday he developed more drainage and irritation of the back of his throat.  He would like a strep test to make sure that is not causing the problem.  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  Patient does not have cough, loss of sense of smell, or diarrhea.     Past Medical History:  Diagnosis Date  . Hiatal hernia   . Hyperlipemia   . Wrist fracture 2010    Patient Active Problem List   Diagnosis Date Noted  . Tendinitis of long head of biceps brachii of left shoulder 09/29/2019  . Subacromial bursitis of left shoulder joint 09/29/2019  . Ganglion cyst 11/03/2013  . Hiatal hernia 11/11/2012  . FATIGUE 08/08/2010  . PSA, INCREASED 08/08/2010  . IFG (impaired fasting glucose) 09/26/2007  . HYPERCHOLESTEROLEMIA, PURE 07/12/2007  . ABNORMAL FINDINGS, ELEVATED BP W/O HTN 07/12/2007    Past Surgical History:  Procedure Laterality Date  . ESOPHAGOGASTRODUODENOSCOPY    . HAIR TRANSPLANT     Dr. Denna Haggard  . LASIK  08/2013   Right       Home Medications    Prior to Admission medications   Medication Sig Start Date End Date Taking? Authorizing Provider  atorvastatin (LIPITOR) 10 MG tablet Take 1 tablet (10 mg total) by mouth daily. 12/04/19   Agapito Games, MD  cetirizine (ZYRTEC) 10 MG tablet Take 1 tablet (10 mg total) by mouth daily. 02/04/16   Agapito Games, MD  cholecalciferol (VITAMIN D) 1000 units tablet Take 5,000 Units by mouth daily.     [provider]  diclofenac sodium (VOLTAREN) 1 % GEL Apply 2 g topically 4 (four) times daily. To  affected joint. 05/04/19   Rodolph Bong, MD  finasteride (PROSCAR) 5 MG tablet Take 0.5 tablets (2.5 mg total) by mouth daily. 04/06/19   Agapito Games, MD  fluticasone (FLONASE) 50 MCG/ACT nasal spray Place 2 sprays into both nostrils daily. 12/16/16   Agapito Games, MD    Family History Family History  Problem Relation Age of Onset  . Hyperlipidemia Mother   . Heart disease Mother        Has pacemaker  . Irregular heart beat Mother        pacemaker  . Hypertension Father     Social History Social History   Tobacco Use  . Smoking status: Never Smoker  . Smokeless tobacco: Never Used  Substance Use Topics  . Alcohol use: Yes    Alcohol/week: 0.0 standard drinks    Comment: 2 per month  . Drug use: No     Allergies   Simvastatin   Review of Systems Review of Systems  Constitutional: Negative for fever.  HENT: Positive for sore throat.   Respiratory: Negative for cough.   Gastrointestinal: Negative for diarrhea.     Physical Exam Triage Vital Signs ED Triage Vitals [12/25/19 0948]  Enc Vitals Group     BP (!) 144/85     Pulse Rate 72     Resp 18  Temp 97.6 F (36.4 C)     Temp Source Oral     SpO2 96 %     Weight 150 lb (68 kg)     Height 5\' 6"  (1.676 m)     Head Circumference      [facility] Flow      Pain Score 1     Pain Loc      Pain Edu?      Excl. in [place]?    No data found.  Updated Vital Signs BP (!) 144/85 (BP Location: Right Arm)   Pulse 72   Temp 97.6 F (36.4 C) (Oral)   Resp 18   Ht 5\' 6"  (1.676 m)   Wt 68 kg   SpO2 96%   BMI 24.21 kg/m    Physical Exam Vitals and nursing note reviewed.  Constitutional:      Appearance: He is well-developed.  HENT:     Head: Normocephalic.     Mouth/Throat:     Mouth: Mucous membranes are moist.     Pharynx: Posterior oropharyngeal erythema present.     Tonsils: No tonsillar exudate or tonsillar abscesses.  Eyes:     Conjunctiva/sclera: Conjunctivae normal.  Cardiovascular:      Rate and Rhythm: Normal rate.     Heart sounds: Normal heart sounds.  Pulmonary:     Effort: Pulmonary effort is normal.  Skin:    General: Skin is warm.  Neurological:     General: No focal deficit present.     Mental Status: He is alert and oriented to person, place, and time.  Psychiatric:        Mood and Affect: Mood normal.      UC Treatments / Results  Labs (all labs ordered are listed, but only abnormal results are displayed) Labs Reviewed  CULTURE, GROUP A STREP [facility])  POCT RAPID STREP A (OFFICE)  POCT RAPID STREP A (OFFICE)    EKG   Radiology No results found.  Procedures Procedures (including critical care time)  Medications Ordered in UC Medications - No data to display  Initial Impression / Assessment and Plan / UC Course  I have reviewed the triage vital signs and the nursing notes.  Pertinent labs & imaging results that were available during my care of the patient were reviewed by me and considered in my medical decision making (see chart for details).    Final Clinical Impressions(s) / UC Diagnoses   Final diagnoses:  Acute pharyngitis, unspecified etiology  Viral pharyngitis     Discharge Instructions     Continue the claritin antihistamine and Vitamin D3    ED Prescriptions    None     I have reviewed the PDMP during this encounter.   Robyn Haber, MD 12/25/19 1008

## 2019-12-26 ENCOUNTER — Ambulatory Visit (INDEPENDENT_AMBULATORY_CARE_PROVIDER_SITE_OTHER): Payer: 59 | Admitting: Physical Therapy

## 2019-12-26 DIAGNOSIS — R293 Abnormal posture: Secondary | ICD-10-CM

## 2019-12-26 DIAGNOSIS — M25512 Pain in left shoulder: Secondary | ICD-10-CM | POA: Diagnosis not present

## 2019-12-26 DIAGNOSIS — R600 Localized edema: Secondary | ICD-10-CM

## 2019-12-26 DIAGNOSIS — M25612 Stiffness of left shoulder, not elsewhere classified: Secondary | ICD-10-CM

## 2019-12-26 NOTE — Therapy (Signed)
[place], [place], [address] Phone: [phone]   Fax:  [phone]  Physical Therapy Treatment  Patient Details  Name: George Young MRN: [id number] Date of Birth: [birth year] Referring Provider (PT): Hiram Gash, MD   Encounter Date: 12/26/2019  PT End of Session - 12/26/19 1107    Visit Number  18    Number of Visits  24    Date for PT Re-Evaluation  02/06/20    Authorization Type  aetna    Authorization - Number of Visits  65    PT Start Time  1105    PT Stop Time  1145    PT Time Calculation (min)  40 min    Activity Tolerance  Patient tolerated treatment well;No increased pain    Behavior During Therapy  WFL for tasks assessed/performed       Past Medical History:  Diagnosis Date  . Hiatal hernia   . Hyperlipemia   . Wrist fracture 2010    Past Surgical History:  Procedure Laterality Date  . ESOPHAGOGASTRODUODENOSCOPY    . HAIR TRANSPLANT     Dr. Eual Fines  . LASIK  08/2013   Right    There were no vitals filed for this visit.  Subjective Assessment - 12/26/19 1110    Subjective  Pt reports he is sleeping more on his Lt shoulder, so it is more sore. HEP is going well.    Currently in Pain?  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 (ICD-10-CM) - Biceps tendonosis of shoulder; Z98.890 (ICD-10-CM) - History of bursectomy    Referring Provider (PT)  Hiram Gash, MD    Onset Date/Surgical Date  10/23/19    Hand Dominance  Right    Next MD Visit  01/23/20    Prior Therapy  had prior shoulder therapy before surgery      PROM   Left Shoulder External Rotation  55 Degrees  (after manual therapy to subscap)      [facility] Adult PT  Treatment/Exercise - 12/26/19 0001      Shoulder Exercises: Supine   Horizontal ABduction  Both;10 reps;Strengthening    Theraband Level (Shoulder Horizontal ABduction)  Level 1 (Yellow)    External Rotation  Both;10 reps;Strengthening   2-3 sec pause in retraction   Theraband Level (Shoulder External Rotation)  Level 1 (Yellow)    Flexion  Both;5 reps;Strengthening    Theraband Level (Shoulder Flexion)  Level 1 (Yellow)    Other Supine Exercises  snow angels to tolerance x 10 reps      Shoulder Exercises: Standing   Flexion  Left;15 reps;Strengthening   [place] 4, forward punch   Theraband Level (Shoulder Flexion)  Level 1 (Yellow)    Row  Strengthening;Left;12 reps    Theraband Level (Shoulder Row)  Level 1 (Yellow)      Shoulder Exercises: Pulleys   Flexion  2 minutes    Scaption  2 minutes    Other Pulley Exercises  IR x 10 reps       Shoulder Exercises: Stretch   Star  Gazer Stretch  10 seconds;5 reps    Other Shoulder Stretches  trial of doorway stretch, midlevel (limited tolerance) x 10 sec    Other Shoulder Stretches  Lt shoulder AAROM ER with cane x 10 sec hold x 10 reps       Modalities   Modalities  --   held; will ice at home     Manual Therapy   Soft tissue mobilization  STM to Lt subscap    Myofascial Release  Lt pec release     Passive ROM  Lt shoulder into ER              PT Education - 12/26/19 1149    Education Details  HEP, updated.    Person(s) Educated  Patient    Methods  Explanation;Handout;Verbal cues;Demonstration    Comprehension  Returned demonstration;Verbalized understanding       PT Short Term Goals - 12/15/19 0851      PT SHORT TERM GOAL #1   Title  The patient will be indep with HEP for pendulum, wrist flexion/extension, AAROM when indicated.    Time  6    Period  Weeks    Status  Achieved    Target Date  12/23/19      PT SHORT TERM GOAL #2   Title  The patient will improve AROM to full elbow AROM.    Time  6    Period   Weeks    Status  Achieved    Target Date  12/23/19      PT SHORT TERM GOAL #3   Title  The patient will have AROM L shoulder to 150 degrees flexion, 90 degrees abduction, 30 degrees ER to demonstrate improving AROM.    Baseline  PROM only performed today.    Time  6    Period  Weeks    Status  Partially Met    Target Date  12/23/19        PT Long Term Goals - 11/09/19 1334      PT LONG TERM GOAL #1   Title  The patient will be indep with progression of HEP.    Time  12    Period  Weeks    Target Date  01/31/20      PT LONG TERM GOAL #2   Title  Improve FOTO to < or equal to 50% limited to demo improved function.    Time  12    Period  Weeks    Target Date  01/31/20      PT LONG TERM GOAL #3   Title  The patient will demonstrate functional AROM ER and IR L shoulder for improved IADL performance.    Time  12    Period  Weeks    Target Date  01/31/20      PT LONG TERM GOAL #4   Title  The patient will perform reaching across his body without pain to demo improved functional use of the L UE.    Time  12    Period  Weeks    Target Date  01/31/20            Plan - 12/26/19 1322    Clinical Impression Statement  Pt now 9 wks s/p Lt rotator cuff repair.  He tolerated new light resistance strengthening exercises without difficulty.  Pt demonstrated 5 deg improvement in Lt shoulder ER after STM to subscap.  Pt continues to progress gradually towards goals.    Examination-Activity  Limitations  Reach Overhead;Lift;Sleep    Rehab Potential  Good    PT Duration  12 weeks    PT Treatment/Interventions  ADLs/Self Care Home Management;Cryotherapy;Electrical Stimulation;Ultrasound;Moist Heat;Therapeutic activities;Therapeutic exercise;Patient/family education;Neuromuscular re-education;Manual techniques;Vasopneumatic Device;Passive range of motion    PT Next Visit Plan  2x/week, continue progressive L shoulder ROM, light strengthening per protocol.    PT Home Exercise Plan   Access Code: [email or web address]    Consulted and Agree with Plan of Care  Patient       Patient will benefit from skilled therapeutic intervention in order to improve the following deficits and impairments:  Pain, Increased edema, Decreased range of motion, Increased fascial restricitons, Impaired UE functional use, Impaired flexibility, Improper body mechanics, Postural dysfunction  Visit Diagnosis: Stiffness of left shoulder, not elsewhere classified  Acute pain of left shoulder  Localized edema  Abnormal posture     Problem List Patient Active Problem List   Diagnosis Date Noted  . Tendinitis of long head of biceps brachii of left shoulder 09/29/2019  . Subacromial bursitis of left shoulder joint 09/29/2019  . Ganglion cyst 11/03/2013  . Hiatal hernia 11/11/2012  . FATIGUE 08/08/2010  . PSA, INCREASED 08/08/2010  . IFG (impaired fasting glucose) 09/26/2007  . HYPERCHOLESTEROLEMIA, PURE 07/12/2007  . ABNORMAL FINDINGS, ELEVATED BP W/O HTN 07/12/2007   Kerin Perna, PTA 12/26/19 1:25 PM  [facility] [place], [place], [address] Phone: [phone]   Fax:  [phone]  Name: George Young MRN: [id number] Date of Birth: Nov 09, 1966

## 2019-12-26 NOTE — Patient Instructions (Signed)
Access Code: [email or web address]  URL: [email or web address]  Date: 12/26/2019  Prepared by: Kerin Perna   Exercises  Seated Shoulder External Rotation AAROM with Cane and Hand in Neutral - 10 reps - 1 sets - 5 hold - 2x daily - 7x weekly  Split Stance Shoulder Row with Resistance - 10 reps - 2 sets - 1x daily - 7x weekly  Single Arm Shoulder Extension with Resistance - 10 reps - 2 sets - 1x daily - 7x weekly  Single Arm Punch with Resistance - 10 reps - 2 sets - 1x daily - 7x weekly  Single Arm Doorway Pec Stretch at 90 Degrees Abduction - 10 reps - 1 sets - 15-20 hold - 2x daily - 7x weekly  Supine Shoulder Horizontal Abduction with Dumbbells - 10 reps - 2 sets - 1x daily - 7x weekly  Supine Bilateral Shoulder External Rotation with Resistance around Wrists - 10 reps - 2 sets - 1x daily - 7x weekly  Supine Chest Stretch with Elbows Bent - 3 reps - 1 sets - 15-20 hold - 1x daily - 7x weekly

## 2019-12-27 LAB — CULTURE, GROUP A STREP
MICRO NUMBER:: 1232873
SPECIMEN QUALITY:: ADEQUATE

## 2019-12-28 ENCOUNTER — Encounter: Payer: 59 | Admitting: Physical Therapy

## 2020-01-01 ENCOUNTER — Ambulatory Visit (INDEPENDENT_AMBULATORY_CARE_PROVIDER_SITE_OTHER): Payer: 59 | Admitting: Rehabilitative and Restorative Service Providers"

## 2020-01-01 ENCOUNTER — Encounter: Payer: Self-pay | Admitting: Rehabilitative and Restorative Service Providers"

## 2020-01-01 ENCOUNTER — Other Ambulatory Visit: Payer: Self-pay

## 2020-01-01 DIAGNOSIS — M25512 Pain in left shoulder: Secondary | ICD-10-CM

## 2020-01-01 DIAGNOSIS — R29898 Other symptoms and signs involving the musculoskeletal system: Secondary | ICD-10-CM

## 2020-01-01 DIAGNOSIS — M25612 Stiffness of left shoulder, not elsewhere classified: Secondary | ICD-10-CM

## 2020-01-01 DIAGNOSIS — R293 Abnormal posture: Secondary | ICD-10-CM

## 2020-01-01 DIAGNOSIS — R600 Localized edema: Secondary | ICD-10-CM

## 2020-01-01 DIAGNOSIS — M6281 Muscle weakness (generalized): Secondary | ICD-10-CM

## 2020-01-01 NOTE — Therapy (Signed)
[place], [place], [address] Phone: [phone]   Fax:  [phone]  Physical Therapy Treatment  Patient Details  Name: George Young MRN: [id number] Date of Birth: Dec 01, 1966 Referring Provider (PT): Hiram Gash, MD   Encounter Date: 01/01/2020  PT End of Session - 01/01/20 1247    Visit Number  19    Number of Visits  24    Date for PT Re-Evaluation  02/06/20    Authorization Type  aetna    Authorization - Number of Visits  60    PT Start Time  0810    PT Stop Time  0850    PT Time Calculation (min)  40 min    Activity Tolerance  Patient tolerated treatment well;No increased pain    Behavior During Therapy  WFL for tasks assessed/performed       Past Medical History:  Diagnosis Date  . Hiatal hernia   . Hyperlipemia   . Wrist fracture 2010    Past Surgical History:  Procedure Laterality Date  . ESOPHAGOGASTRODUODENOSCOPY    . HAIR TRANSPLANT     Dr. Eual Fines  . LASIK  08/2013   Right    There were no vitals filed for this visit.  Subjective Assessment - 01/01/20 0814    Subjective  The patient reports no pain.    Pertinent History  has R arm tennis elbow, hypercholesterolemia, elevated sugar (A1C was 6 last time)    Patient Stated Goals  Be able to use the left arm as well as prior function.    Currently in Pain?  No/denies                       [facility] Adult PT Treatment/Exercise - 01/01/20 0814      Exercises   Exercises  Shoulder      Shoulder Exercises: Prone   Retraction  15 reps;Strengthening;Both    Retraction Limitations  prone shoulder blade squeeze with extension    Flexion  Strengthening;Left;10 reps    Flexion Limitations  short arm flexion prone with c/o "stretching pain" L anterior deltoid region    External Rotation  Strengthening;Left;10 reps    External Rotation Limitations  arm supported at 90 degrees and ER against gravity    Other  Prone Exercises  prone rows x 10 reps    Other Prone Exercises  prone elbow extended with horizontal abduction x 10 reps with cues for scapular mobility      Shoulder Exercises: Standing   Protraction  15 reps;Strengthening    Protraction Limitations  on wall with arms extended    Flexion  Both    Flexion Limitations  short arm flexion with palms together    Other Standing Exercises  wall slides in elbow on wall position for scapular stability x 10 reps      Shoulder Exercises: Stretch   External Rotation Stretch  2 reps;30 seconds   L UE at door frame at 60 and 90 abd   Wall Stretch - ABduction  2 reps;30 seconds      Vasopneumatic   Number Minutes Vasopneumatic   10 minutes    Vasopnuematic Location   Shoulder    Vasopneumatic Pressure  Low    Vasopneumatic Temperature   34      Manual Therapy   Manual Therapy  Soft tissue mobilization;Joint mobilization    Manual therapy comments  in supine    Joint Mobilization  grade I for muscle relaxation and pain reduction    Soft tissue mobilization  STM to L anterior deltoid, bicipital groove               PT Short Term Goals - 12/15/19 0851      PT SHORT TERM GOAL #1   Title  The patient will be indep with HEP for pendulum, wrist flexion/extension, AAROM when indicated.    Time  6    Period  Weeks    Status  Achieved    Target Date  12/23/19      PT SHORT TERM GOAL #2   Title  The patient will improve AROM to full elbow AROM.    Time  6    Period  Weeks    Status  Achieved    Target Date  12/23/19      PT SHORT TERM GOAL #3   Title  The patient will have AROM L shoulder to 150 degrees flexion, 90 degrees abduction, 30 degrees ER to demonstrate improving AROM.    Baseline  PROM only performed today.    Time  6    Period  Weeks    Status  Partially Met    Target Date  12/23/19        PT Long Term Goals - 11/09/19 1334      PT LONG TERM GOAL #1   Title  The patient will be indep with progression of HEP.     Time  12    Period  Weeks    Target Date  01/31/20      PT LONG TERM GOAL #2   Title  Improve FOTO to < or equal to 50% limited to demo improved function.    Time  12    Period  Weeks    Target Date  01/31/20      PT LONG TERM GOAL #3   Title  The patient will demonstrate functional AROM ER and IR L shoulder for improved IADL performance.    Time  12    Period  Weeks    Target Date  01/31/20      PT LONG TERM GOAL #4   Title  The patient will perform reaching across his body without pain to demo improved functional use of the L UE.    Time  12    Period  Weeks    Target Date  01/31/20            Plan - 01/01/20 1254    Clinical Impression Statement  The patient is 10 weeks s/p L rotator cuff repair and progressing with strengthening and flexibility.  PT emphasized soft tissue mobilization of L anterior deltoid region due to c/o discomfort with end range movements.  Plan to continue to progress towards goals anticipating reducing to 1x/week with more emphasis on HEP progression (patient compliant performing 2x/day).    PT Treatment/Interventions  ADLs/Self Care Home Management;Cryotherapy;Electrical Stimulation;Ultrasound;Moist Heat;Therapeutic activities;Therapeutic exercise;Patient/family education;Neuromuscular re-education;Manual techniques;Vasopneumatic Device;Passive range of motion    PT Next Visit Plan  *Follow up on L anterior deltoid tightness and pain-- perform STM and stretching as indicated;  2x/week planning to reduce to 1x/week, continue progressive L shoulder ROM, light strengthening per protocol.    PT Home Exercise Plan  Access Code: [email or web address]    Consulted and Agree with Plan of Care  Patient       Patient will benefit from skilled therapeutic intervention in order to improve the following deficits and  impairments:  Pain, Increased edema, Decreased range of motion, Increased fascial restricitons, Impaired UE functional use, Impaired flexibility, Improper body  mechanics, Postural dysfunction  Visit Diagnosis: Stiffness of left shoulder, not elsewhere classified  Acute pain of left shoulder  Localized edema  Abnormal posture  Other symptoms and signs involving the musculoskeletal system  Muscle weakness (generalized)     Problem List Patient Active Problem List   Diagnosis Date Noted  . Tendinitis of long head of biceps brachii of left shoulder 09/29/2019  . Subacromial bursitis of left shoulder joint 09/29/2019  . Ganglion cyst 11/03/2013  . Hiatal hernia 11/11/2012  . FATIGUE 08/08/2010  . PSA, INCREASED 08/08/2010  . IFG (impaired fasting glucose) 09/26/2007  . HYPERCHOLESTEROLEMIA, PURE 07/12/2007  . ABNORMAL FINDINGS, ELEVATED BP W/O HTN 07/12/2007    Indea Dearman, PT 01/01/2020, 1:08 PM  [facility] [place], [place], [address] Phone: [phone]   Fax:  [phone]  Name: George Young MRN: [id number] Date of Birth: 1966/11/29

## 2020-01-03 ENCOUNTER — Encounter: Payer: Self-pay | Admitting: Physical Therapy

## 2020-01-03 ENCOUNTER — Ambulatory Visit (INDEPENDENT_AMBULATORY_CARE_PROVIDER_SITE_OTHER): Payer: 59 | Admitting: Physical Therapy

## 2020-01-03 ENCOUNTER — Other Ambulatory Visit: Payer: Self-pay

## 2020-01-03 DIAGNOSIS — M25612 Stiffness of left shoulder, not elsewhere classified: Secondary | ICD-10-CM

## 2020-01-03 DIAGNOSIS — M25512 Pain in left shoulder: Secondary | ICD-10-CM | POA: Diagnosis not present

## 2020-01-03 DIAGNOSIS — R293 Abnormal posture: Secondary | ICD-10-CM | POA: Diagnosis not present

## 2020-01-03 DIAGNOSIS — R600 Localized edema: Secondary | ICD-10-CM | POA: Diagnosis not present

## 2020-01-03 NOTE — Therapy (Signed)
[place], [place], [address] Phone: [phone]   Fax:  [phone]  Physical Therapy Treatment  Patient Details  Name: George Young MRN: [id number] Date of Birth: [birth year] Referring Provider (PT): Hiram Gash, MD   Encounter Date: 01/03/2020  PT End of Session - 01/03/20 0810    Visit Number  20    Number of Visits  24    Date for PT Re-Evaluation  02/06/20    Authorization Type  aetna    Authorization - Number of Visits  8    PT Start Time  0806   pt arrived late   PT Stop Time  0851    PT Time Calculation (min)  45 min    Activity Tolerance  Patient tolerated treatment well    Behavior During Therapy  [facility] for tasks assessed/performed       Past Medical History:  Diagnosis Date  . Hiatal hernia   . Hyperlipemia   . Wrist fracture 2010    Past Surgical History:  Procedure Laterality Date  . ESOPHAGOGASTRODUODENOSCOPY    . HAIR TRANSPLANT     Dr. Eual Fines  . LASIK  08/2013   Right    There were no vitals filed for this visit.  Subjective Assessment - 01/03/20 0811    Subjective  Pt reports his biggest difficulty is raising Lt arm up without letting shoulder blade elevate.  Front of shoulder is still tender from STM last session.    Currently in Pain?  No/denies    Pain Score  0-No pain         [facility] PT Assessment - 01/03/20 0001      Assessment   Medical Diagnosis  M67.819 (ICD-10-CM) - Biceps tendonosis of shoulder; Z98.890 (ICD-10-CM) - History of bursectomy    Referring Provider (PT)  Hiram Gash, MD    Onset Date/Surgical Date  10/23/19    Hand Dominance  Right    Next MD Visit  01/23/20    Prior Therapy  had prior shoulder therapy before surgery      PROM   Left Shoulder External Rotation  63 Degrees   supine, elbow supported on towel.      [facility] Adult PT Treatment/Exercise - 01/03/20 0001      Shoulder Exercises: Supine   ABduction  AAROM;Left;5  reps   cane   Other Supine Exercises  snow angels to tolerance x 5 reps      Shoulder Exercises: ROM/Strengthening   UBE (Upper Arm Bike)  L1: 1 min forward, 1 min backward standing      Shoulder Exercises: Stretch   Star Gazer Stretch  2 reps;10 seconds    Other Shoulder Stretches  low, mid and high level Lt doorway stretch with elbow straight x 15 sec each, with tactile cues to correct form     Other Shoulder Stretches  supine - LTR with Lt arm in T and Y position x 15 sec x 3 reps       Vasopneumatic   Number Minutes Vasopneumatic   10 minutes    Vasopnuematic Location   Shoulder   Lt   Vasopneumatic Pressure  Low    Vasopneumatic Temperature   34      Manual Therapy   Manual therapy comments  in supine    Soft tissue mobilization  STM to ant shoulder, subscap, infraspinatus     Myofascial Release  Lt pec    Passive ROM  Lt shoulder into ext, horiz abdct, ER, IR                PT Short Term Goals - 12/15/19 7867      PT SHORT TERM GOAL #1   Title  The patient will be indep with HEP for pendulum, wrist flexion/extension, AAROM when indicated.    Time  6    Period  Weeks    Status  Achieved    Target Date  12/23/19      PT SHORT TERM GOAL #2   Title  The patient will improve AROM to full elbow AROM.    Time  6    Period  Weeks    Status  Achieved    Target Date  12/23/19      PT SHORT TERM GOAL #3   Title  The patient will have AROM L shoulder to 150 degrees flexion, 90 degrees abduction, 30 degrees ER to demonstrate improving AROM.    Baseline  PROM only performed today.    Time  6    Period  Weeks    Status  Partially Met    Target Date  12/23/19        PT Long Term Goals - 11/09/19 1334      PT LONG TERM GOAL #1   Title  The patient will be indep with progression of HEP.    Time  12    Period  Weeks    Target Date  01/31/20      PT LONG TERM GOAL #2   Title  Improve FOTO to < or equal to 50% limited to demo improved function.    Time  12     Period  Weeks    Target Date  01/31/20      PT LONG TERM GOAL #3   Title  The patient will demonstrate functional AROM ER and IR L shoulder for improved IADL performance.    Time  12    Period  Weeks    Target Date  01/31/20      PT LONG TERM GOAL #4   Title  The patient will perform reaching across his body without pain to demo improved functional use of the L UE.    Time  12    Period  Weeks    Target Date  01/31/20            Plan - 01/03/20 1251    Clinical Impression Statement  Palpable tightness noted in Lt shoulder subscap and pec; improved with STM and supine pec stretch.  Pt demonstrated improved Lt shoulder ER PROM after manual therapy. Pt making gains towards remaining goals.    PT Frequency  1x / week   per last PT note.   PT Duration  12 weeks    PT Treatment/Interventions  ADLs/Self Care Home Management;Cryotherapy;Electrical Stimulation;Ultrasound;Moist Heat;Therapeutic activities;Therapeutic exercise;Patient/family education;Neuromuscular re-education;Manual techniques;Vasopneumatic Device;Passive range of motion    PT Next Visit Plan  PROM to Lt shoulder;  STM to lat, subscap, pec, levator    PT Home Exercise Plan  Access Code: [email or web address]    Consulted and Agree with Plan of Care  Patient       Patient will benefit from skilled therapeutic intervention in order to improve the following deficits and impairments:  Pain, Increased edema, Decreased range of motion, Increased fascial restricitons, Impaired UE functional use, Impaired flexibility, Improper body mechanics, Postural dysfunction  Visit Diagnosis: Stiffness of left shoulder, not elsewhere classified  Acute  pain of left shoulder  Localized edema  Abnormal posture     Problem List Patient Active Problem List   Diagnosis Date Noted  . Tendinitis of long head of biceps brachii of left shoulder 09/29/2019  . Subacromial bursitis of left shoulder joint 09/29/2019  . Ganglion cyst 11/03/2013  .  Hiatal hernia 11/11/2012  . FATIGUE 08/08/2010  . PSA, INCREASED 08/08/2010  . IFG (impaired fasting glucose) 09/26/2007  . HYPERCHOLESTEROLEMIA, PURE 07/12/2007  . ABNORMAL FINDINGS, ELEVATED BP W/O HTN 07/12/2007   Kerin Perna, PTA 01/03/20 12:54 PM  [place], [place], [address] Phone: [phone]   Fax:  [phone]  Name: Keithen Capo MRN: [id number] Date of Birth: November 23, 1966

## 2020-01-10 ENCOUNTER — Ambulatory Visit (INDEPENDENT_AMBULATORY_CARE_PROVIDER_SITE_OTHER): Payer: 59 | Admitting: Physical Therapy

## 2020-01-10 ENCOUNTER — Other Ambulatory Visit: Payer: Self-pay

## 2020-01-10 DIAGNOSIS — R293 Abnormal posture: Secondary | ICD-10-CM | POA: Diagnosis not present

## 2020-01-10 DIAGNOSIS — M25612 Stiffness of left shoulder, not elsewhere classified: Secondary | ICD-10-CM | POA: Diagnosis not present

## 2020-01-10 DIAGNOSIS — M25512 Pain in left shoulder: Secondary | ICD-10-CM | POA: Diagnosis not present

## 2020-01-10 NOTE — Patient Instructions (Addendum)
Over Head Pull: Narrow Grip     [place] [phone]   On back, knees bent, feet flat, band across thighs, elbows straight but relaxed. Pull hands apart (start). Keeping elbows straight, bring arms up and over head, hands toward floor. Keep pull steady on band. Hold momentarily. Return slowly, keeping pull steady, back to start. Repeat _10__ times. Band color __yellow/red____   Side Pull: Double Arm   On back, knees bent, feet flat. Arms perpendicular to body, shoulder level, elbows straight but relaxed. Pull arms out to sides, elbows straight. Resistance band comes across collarbones, hands toward floor. Hold momentarily. Slowly return to starting position. Repeat _10__ times. Band color __yellow/red___   [place]   On back, knees bent, feet flat, right hand on right hip. Pull right arm DIAGONALLY (hip to shoulder) across chest. Bring left arm along head toward floor. Hold momentarily. Slowly return to starting position.  THUMB UP. Repeat _10__ times. Do with left arm. Band color __yellow/red____   Shoulder Rotation: Double Arm   On back, knees bent, feet flat, elbows tucked at sides, bent 90, hands palms up. Pull hands apart and down toward floor, keeping elbows near sides. Hold momentarily. Slowly return to starting position. Repeat _10__ times. Band color __yellow/red ____  Access Code: [email or web address]  URL: [email or web address]  Date: 01/10/2020  Prepared by: Mayer Camel   Exercises  Single Arm Punch with Resistance - 10 reps - 2 sets - 1x daily - 3x weekly  Split Stance Shoulder Row with Resistance - 10 reps - 2 sets - 1x daily - 3x weekly  Single Arm Shoulder Extension with Resistance - 10 reps - 2 sets - 1x daily - 3x weekly  Standing Elbow Extension with Anchored Resistance - 10 reps - 2 sets - 1x daily - 3x weekly  Standing Bicep Curls with Resistance - 10 reps - 2 sets - 1x daily - 3x weekly  Supine Bilateral Shoulder External Rotation with Resistance around  Wrists - 10 reps - 2 sets - 1x daily - 3x weekly  Supine Shoulder Horizontal Abduction with Dumbbells - 10 reps - 2 sets - 1x daily - 3x weekly  Single Arm Doorway Pec Stretch at 90 Degrees Abduction - 2 reps - 1 sets - 15-20 hold - 2x daily - 7x weekly  Supine Chest Stretch with Elbows Bent - 3 reps - 1 sets - 15-20 hold - 1x daily - 7x weekly  Supine Shoulder External Rotation in 45 Degrees Abduction AAROM with Dowel - 5 reps - 1 sets - 15-20 hold - 1x daily - 7x weekly  Standing Pec Stretch at Wall - 2-3 reps - 1 sets - 15-20 hold - 1x daily - 7x weekly

## 2020-01-10 NOTE — Therapy (Addendum)
[place], [place], [address] Phone: [phone]   Fax:  [phone]  Physical Therapy Treatment and Discharge Summary  Patient Details  Name: George Young MRN: [id number] Date of Birth: [birth year] Referring Provider (PT): Hiram Gash, MD   Encounter Date: 01/10/2020  PT End of Session - 01/10/20 1603    Visit Number  21    Number of Visits  24    Date for PT Re-Evaluation  02/06/20    Authorization Type  aetna    PT Start Time  1602    PT Stop Time  0488    PT Time Calculation (min)  39 min    Activity Tolerance  Patient tolerated treatment well;No increased pain    Behavior During Therapy  WFL for tasks assessed/performed       Past Medical History:  Diagnosis Date  . Hiatal hernia   . Hyperlipemia   . Wrist fracture 2010    Past Surgical History:  Procedure Laterality Date  . ESOPHAGOGASTRODUODENOSCOPY    . HAIR TRANSPLANT     Dr. Eual Fines  . LASIK  08/2013   Right    There were no vitals filed for this visit.  Subjective Assessment - 01/10/20 1604    Subjective  Pt reports he is happy with progress so far.  He would like to update his HEP and hold therapy after today's visit.    Patient Stated Goals  Be able to use the left arm as well as prior function.    Currently in Pain?  No/denies    Pain Score  0-No pain         [facility] PT Assessment - 01/10/20 0001      Assessment   Medical Diagnosis  M67.819 (ICD-10-CM) - Biceps tendonosis of shoulder; Z98.890 (ICD-10-CM) - History of bursectomy    Referring Provider (PT)  Hiram Gash, MD    Onset Date/Surgical Date  10/23/19    Hand Dominance  Right    Next MD Visit  01/23/20    Prior Therapy  had prior shoulder therapy before surgery      Observation/Other Assessments   Focus on Therapeutic Outcomes (FOTO)   77% (23% limitation)      PROM   Left Shoulder Flexion  160 Degrees    Left Shoulder External Rotation  65  Degrees   supine, elbow supported on towel.       [place] Adult PT Treatment/Exercise - 01/10/20 0001      Elbow Exercises   Elbow Flexion  Strengthening;Left;10 reps    Theraband Level (Elbow Flexion)  Level 2 (Red)    Elbow Extension  Strengthening;Left;10 reps    Theraband Level (Elbow Extension)  Level 2 (Red)      Shoulder Exercises: Supine   Horizontal ABduction  Both;10 reps    Theraband Level (Shoulder Horizontal ABduction)  Level 2 (Red)    Horizontal ABduction Limitations  horiz abdct/add with cane x 10 reps    External Rotation  Both;10 reps;Strengthening    Theraband Level (Shoulder External Rotation)  Level 2 (Red)    Flexion  Both;10 reps   bilat overhead pull    ABduction  AAROM;Left;5 reps   cane   Diagonals  Left;10 reps;Strengthening    Theraband Level (Shoulder Diagonals)  Level 2 (Red)    Other Supine Exercises  --      Shoulder Exercises: Standing   External Rotation  Strengthening;Both;5 reps    Theraband  Level (Shoulder External Rotation)  Level 2 (Red)    Flexion  Left;5 reps   forward punch   Theraband Level (Shoulder Flexion)  Level 2 (Red)    Extension  Both;10 reps;Strengthening    Theraband Level (Shoulder Extension)  Level 2 (Red)    Row  --   reviewed verbally   Diagonals  Left;10 reps;Strengthening   D1 flex/ ext   Theraband Level (Shoulder Diagonals)  Level 2 (Red)      Shoulder Exercises: ROM/Strengthening   UBE (Upper Arm Bike)  L1: 1 min forward      Shoulder Exercises: Psychologist, sport and exercise  --   verbally reviewed.    Other Shoulder Stretches  ant/post sleeper stretch x 15 sec x 3 reps each direction     Other Shoulder Stretches  Midlevel doorway stretch x 15 sec;  Lt chest stretch with elbow straight x 20 sec       Modalities   Modalities  --   held              PT Short Term Goals - 01/10/20 1658      PT SHORT TERM GOAL #1   Title  The patient will be indep with HEP for pendulum, wrist flexion/extension,  AAROM when indicated.    Time  6    Period  Weeks    Status  Achieved    Target Date  12/23/19      PT SHORT TERM GOAL #2   Title  The patient will improve AROM to full elbow AROM.    Time  6    Period  Weeks    Status  Achieved    Target Date  12/23/19      PT SHORT TERM GOAL #3   Title  The patient will have AROM L shoulder to 150 degrees flexion, 90 degrees abduction, 30 degrees ER to demonstrate improving AROM.    Baseline  PROM only performed today.    Time  6    Period  Weeks    Status  Achieved    Target Date  12/23/19        PT Long Term Goals - 01/10/20 1651      PT LONG TERM GOAL #1   Title  The patient will be indep with progression of HEP.    Time  12    Period  Weeks    Status  Partially Met      PT LONG TERM GOAL #2   Title  Improve FOTO to < or equal to 50% limited to demo improved function.    Time  12    Period  Weeks    Status  Achieved      PT LONG TERM GOAL #3   Title  The patient will demonstrate functional AROM ER and IR L shoulder for improved IADL performance.    Time  12    Period  Weeks    Status  Partially Met      PT LONG TERM GOAL #4   Title  The patient will perform reaching across his body without pain to demo improved functional use of the L UE.    Time  12    Period  Weeks    Status  Achieved            Plan - 01/10/20 1652    Clinical Impression Statement  Pt is now 11 wks s/p rotator cuff repair for LUE.  Pt demonstrates  gradual improvement in Lt shoulder ROM. Improved FOTO score; has met LTG #2 He tolerated increased red band for Lt shoulder strengthening without difficulty.  Pt has partially met his goals and requests to hold therapy until after his MD visit at end of month.  Financial concerns with insurance changes since beginning of the year.  HEP updated today.    PT Frequency  1x / week   per last PT note.   PT Duration  12 weeks    PT Treatment/Interventions  ADLs/Self Care Home Management;Cryotherapy;Electrical  Stimulation;Ultrasound;Moist Heat;Therapeutic activities;Therapeutic exercise;Patient/family education;Neuromuscular re-education;Manual techniques;Vasopneumatic Device;Passive range of motion    PT Next Visit Plan  PROM to Lt shoulder;  STM to lat, subscap, pec, levator    PT Home Exercise Plan  Access Code: [email or web address]    Consulted and Agree with Plan of Care  Patient       Patient will benefit from skilled therapeutic intervention in order to improve the following deficits and impairments:  Pain, Increased edema, Decreased range of motion, Increased fascial restricitons, Impaired UE functional use, Impaired flexibility, Improper body mechanics, Postural dysfunction  Visit Diagnosis: Stiffness of left shoulder, not elsewhere classified  Acute pain of left shoulder  Abnormal posture   PHYSICAL THERAPY DISCHARGE SUMMARY  Visits from Start of Care: 21  Current functional level related to goals / functional outcomes: See above   Remaining deficits: *See note for patient status.  He requested to hold due to high copay.  He did not return to therapy.   Education / Equipment: HEP.  Plan: Patient agrees to discharge.  Patient goals were partially met. Patient is being discharged due to meeting the stated rehab goals.  ?????         Problem List Patient Active Problem List   Diagnosis Date Noted  . Tendinitis of long head of biceps brachii of left shoulder 09/29/2019  . Subacromial bursitis of left shoulder joint 09/29/2019  . Ganglion cyst 11/03/2013  . Hiatal hernia 11/11/2012  . FATIGUE 08/08/2010  . PSA, INCREASED 08/08/2010  . IFG (impaired fasting glucose) 09/26/2007  . HYPERCHOLESTEROLEMIA, PURE 07/12/2007  . ABNORMAL FINDINGS, ELEVATED BP W/O HTN 07/12/2007    Thank you for the referral of this patient. Rudell Cobb, MPT  Kerin Perna, [place] 01/10/20 4:58 PM  [facility] [place], [place], [address] Phone: [phone]   Fax:  [phone]  Name: George Young MRN: [id number] Date of Birth: [birth year]

## 2020-05-15 ENCOUNTER — Encounter: Payer: Self-pay | Admitting: Emergency Medicine

## 2020-05-15 ENCOUNTER — Emergency Department (INDEPENDENT_AMBULATORY_CARE_PROVIDER_SITE_OTHER)
Admission: EM | Admit: 2020-05-15 | Discharge: 2020-05-15 | Disposition: A | Discharge status: Home / Self Care | Payer: 59 | Source: Home / Self Care

## 2020-05-15 ENCOUNTER — Other Ambulatory Visit: Payer: Self-pay

## 2020-05-15 DIAGNOSIS — T148XXA Other injury of unspecified body region, initial encounter: Secondary | ICD-10-CM | POA: Diagnosis not present

## 2020-05-15 NOTE — ED Provider Notes (Signed)
[facility]    CSN: [id number] Arrival date & time: 05/15/20  1223      History   Chief Complaint Chief Complaint  Patient presents with  . bleeding sutures    HPI George Young is a 54 y.o. male comes to urgent care with complaints of bleeding from the surgical incision site.  Patient underwent follicle unit transplant yesterday.  Laceration on the occipital scalp started bleeding this morning.  He has been applying some pressure to it.   HPI  Past Medical History:  Diagnosis Date  . Hiatal hernia   . Hyperlipemia   . Wrist fracture 2010    Patient Active Problem List   Diagnosis Date Noted  . Tendinitis of long head of biceps brachii of left shoulder 09/29/2019  . Subacromial bursitis of left shoulder joint 09/29/2019  . Ganglion cyst 11/03/2013  . Hiatal hernia 11/11/2012  . FATIGUE 08/08/2010  . PSA, INCREASED 08/08/2010  . IFG (impaired fasting glucose) 09/26/2007  . HYPERCHOLESTEROLEMIA, PURE 07/12/2007  . ABNORMAL FINDINGS, ELEVATED BP W/O HTN 07/12/2007    Past Surgical History:  Procedure Laterality Date  . ESOPHAGOGASTRODUODENOSCOPY    . HAIR TRANSPLANT     Dr. Denna Haggard  . LASIK  08/2013   Right       Home Medications    Prior to Admission medications   Medication Sig Start Date End Date Taking? Authorizing Provider  finasteride (PROPECIA) 1 MG tablet Take 1 mg by mouth daily.   Yes [provider]  atorvastatin (LIPITOR) 10 MG tablet Take 1 tablet (10 mg total) by mouth daily. 12/04/19   Agapito Games, MD  cetirizine (ZYRTEC) 10 MG tablet Take 1 tablet (10 mg total) by mouth daily. 02/04/16   Agapito Games, MD  cholecalciferol (VITAMIN D) 1000 units tablet Take 5,000 Units by mouth daily.     [provider]  diclofenac sodium (VOLTAREN) 1 % GEL Apply 2 g topically 4 (four) times daily. To affected joint. 05/04/19   Rodolph Bong, MD  fluticasone (FLONASE) 50 MCG/ACT nasal spray Place 2 sprays into  both nostrils daily. 12/16/16   Agapito Games, MD    Family History Family History  Problem Relation Age of Onset  . Hyperlipidemia Mother   . Heart disease Mother        Has pacemaker  . Irregular heart beat Mother        pacemaker  . Hypertension Father     Social History Social History   Tobacco Use  . Smoking status: Never Smoker  . Smokeless tobacco: Never Used  Substance Use Topics  . Alcohol use: Yes    Alcohol/week: 0.0 standard drinks    Comment: 2 per month  . Drug use: No     Allergies   Simvastatin   Review of Systems Review of Systems  Skin: Positive for wound. Negative for color change and rash.  Hematological: Does not bruise/bleed easily.     Physical Exam Triage Vital Signs ED Triage Vitals  Enc Vitals Group     BP 05/15/20 1251 (!) 144/87     Pulse Rate 05/15/20 1251 67     Resp --      Temp 05/15/20 1251 98.2 F (36.8 C)     Temp Source 05/15/20 1251 Oral     SpO2 05/15/20 1251 100 %     Weight 05/15/20 1252 143 lb (64.9 kg)     Height 05/15/20 1252 5\' 6"  (1.676 m)  Head Circumference --      Peak Flow --      Pain Score 05/15/20 1252 3     Pain Loc --      Pain Edu? --      Excl. in [place]? --    No data found.  Updated Vital Signs BP (!) 144/87 (BP Location: Right Arm)   Pulse 67   Temp 98.2 F (36.8 C) (Oral)   Ht 5\' 6"  (1.676 m)   Wt 64.9 kg   SpO2 100%   BMI 23.08 kg/m   Visual Acuity Right Eye Distance:   Left Eye Distance:   Bilateral Distance:    Right Eye Near:   Left Eye Near:    Bilateral Near:     Physical Exam HENT:     Head: Normocephalic and atraumatic.     Comments: Follicle transplant area in the frontotemporal scalp. Slight  bleeding from the occipital scalp.     UC Treatments / Results  Labs (all labs ordered are listed, but only abnormal results are displayed) Labs Reviewed - No data to display  EKG   Radiology No results found.  Procedures Procedures (including critical  care time)  Medications Ordered in UC Medications - No data to display  Initial Impression / Assessment and Plan / UC Course  I have reviewed the triage vital signs and the nursing notes.  Pertinent labs & imaging results that were available during my care of the patient were reviewed by me and considered in my medical decision making (see chart for details).     1.  Bleeding from surgical site: Patient is encouraged to apply pressure to the bleeding site Ice pack Reach out to be surgical team if bleeding is persistent or if bleeding worsens. Final Clinical Impressions(s) / UC Diagnoses   Final diagnoses:  Hemorrhage from wound     Discharge Instructions     Apply ice pack to be area of bleeding Apply pressure to the bleeding site If the bleeding persists, reach out to the surgical team to be reevaluated    ED Prescriptions    None     PDMP not reviewed this encounter.   Chase Picket, MD 05/15/20 1319

## 2020-05-15 NOTE — ED Triage Notes (Signed)
Bleeding sutures on back left side of scalp since last night. FUT< folicule unit transplant., unable to stop the bleeding.

## 2020-05-15 NOTE — Discharge Instructions (Signed)
Apply ice pack to be area of bleeding Apply pressure to the bleeding site If the bleeding persists, reach out to the surgical team to be reevaluated

## 2020-05-24 ENCOUNTER — Other Ambulatory Visit: Payer: Self-pay | Admitting: Family Medicine

## 2020-06-12 LAB — LIPID PANEL
Cholesterol: 170 (ref 0–200)
HDL: 53 (ref 35–70)
LDL Cholesterol: 99
Triglycerides: 98 (ref 40–160)

## 2020-06-12 LAB — BASIC METABOLIC PANEL: Glucose: 108

## 2020-06-12 LAB — HEMOGLOBIN A1C: Hemoglobin A1C: 5.8

## 2020-06-25 ENCOUNTER — Other Ambulatory Visit: Payer: Self-pay

## 2020-06-25 DIAGNOSIS — E7800 Pure hypercholesterolemia, unspecified: Secondary | ICD-10-CM

## 2020-06-25 MED ORDER — ATORVASTATIN CALCIUM 10 MG PO TABS: 10.0000 mg | ORAL_TABLET | Freq: Every day | ORAL | 3 refills | Status: DC

## 2020-07-03 ENCOUNTER — Other Ambulatory Visit: Payer: Self-pay

## 2020-07-03 DIAGNOSIS — E7800 Pure hypercholesterolemia, unspecified: Secondary | ICD-10-CM

## 2020-07-03 MED ORDER — ATORVASTATIN CALCIUM 10 MG PO TABS: 10.0000 mg | ORAL_TABLET | Freq: Every day | ORAL | 3 refills | Status: DC

## 2020-08-12 ENCOUNTER — Other Ambulatory Visit: Payer: Self-pay | Admitting: Family Medicine

## 2020-09-20 ENCOUNTER — Encounter: Payer: Self-pay | Admitting: Family Medicine

## 2020-09-25 ENCOUNTER — Other Ambulatory Visit: Payer: Self-pay | Admitting: *Deleted

## 2020-09-25 DIAGNOSIS — E7800 Pure hypercholesterolemia, unspecified: Secondary | ICD-10-CM

## 2020-09-25 DIAGNOSIS — Z1211 Encounter for screening for malignant neoplasm of colon: Secondary | ICD-10-CM

## 2020-09-25 DIAGNOSIS — Z0000 Encounter for general adult medical examination without abnormal findings: Secondary | ICD-10-CM

## 2020-10-02 LAB — LIPID PANEL
Cholesterol: 174 mg/dL (ref <200)
HDL: 46 mg/dL (ref >=40)
LDL Cholesterol (Calc): 107 mg/dL (calc) — ABNORMAL HIGH
Non-HDL Cholesterol (Calc): 128 mg/dL (calc) (ref <130)
Total CHOL/HDL Ratio: 3.8 (calc) (ref <5.0)
Triglycerides: 117 mg/dL (ref <150)

## 2020-10-02 LAB — COMPLETE METABOLIC PANEL WITH GFR
AG Ratio: 2.2 (calc) (ref 1.0–2.5)
ALT: 16 U/L (ref 9–46)
AST: 14 U/L (ref 10–35)
Albumin: 4.6 g/dL (ref 3.6–5.1)
Alkaline phosphatase (APISO): 60 U/L (ref 35–144)
BUN: 15 mg/dL (ref 7–25)
CO2: 31 mmol/L (ref 20–32)
Calcium: 9.8 mg/dL (ref 8.6–10.3)
Chloride: 102 mmol/L (ref 98–110)
Creat: 0.8 mg/dL (ref 0.70–1.33)
GFR, Est African American: 118 mL/min/{1.73_m2} (ref >=60)
GFR, Est Non African American: 102 mL/min/{1.73_m2} (ref >=60)
Globulin: 2.1 g/dL (calc) (ref 1.9–3.7)
Glucose, Bld: 117 mg/dL — ABNORMAL HIGH (ref 65–99)
Potassium: 4.2 mmol/L (ref 3.5–5.3)
Sodium: 140 mmol/L (ref 135–146)
Total Bilirubin: 0.6 mg/dL (ref 0.2–1.2)
Total Protein: 6.7 g/dL (ref 6.1–8.1)

## 2020-10-02 LAB — HEMOGLOBIN A1C
Hgb A1c MFr Bld: 6 % of total Hgb — ABNORMAL HIGH (ref <5.7)
Mean Plasma Glucose: 126 (calc)
eAG (mmol/L): 7 (calc)

## 2020-10-02 LAB — PSA: PSA: 1.56 ng/mL (ref <=4.0)

## 2020-10-10 ENCOUNTER — Other Ambulatory Visit: Payer: Self-pay

## 2020-10-10 ENCOUNTER — Encounter: Payer: Self-pay | Admitting: Family Medicine

## 2020-10-10 ENCOUNTER — Ambulatory Visit (INDEPENDENT_AMBULATORY_CARE_PROVIDER_SITE_OTHER): Payer: 59 | Admitting: Family Medicine

## 2020-10-10 ENCOUNTER — Other Ambulatory Visit: Payer: Self-pay | Admitting: *Deleted

## 2020-10-10 VITALS — BP 127/65 | HR 75 | Ht 66.0 in | Wt 145.0 lb

## 2020-10-10 DIAGNOSIS — Z0000 Encounter for general adult medical examination without abnormal findings: Secondary | ICD-10-CM

## 2020-10-10 NOTE — Patient Instructions (Signed)

## 2020-10-10 NOTE — Progress Notes (Signed)
Established Patient Office Visit  Subjective:  Patient ID: George Young, male    DOB: 09-05-1966  Age: 54 y.o. MRN: [id number]  CC:  Chief Complaint  Patient presents with  . Annual Exam    HPI George Young presents for CPE. Went for labs last week. All stable.  He sees his Cologuard kit in the mail.  Already had his labs done last week.   He actually did a program through [facility] for prediabetes over the last year in fact he just finished it up.  He has lost 15 pounds and has really worked on portion control and eating more healthy and exercising.  He is really done a fantastic job   Past Medical History:  Diagnosis Date  . Hiatal hernia   . Hyperlipemia   . Wrist fracture 2010    Past Surgical History:  Procedure Laterality Date  . ESOPHAGOGASTRODUODENOSCOPY    . HAIR TRANSPLANT     Dr. Eual Fines  . LASIK  08/2013   Right    Family History  Problem Relation Age of Onset  . Hyperlipidemia Mother   . Heart disease Mother        Has pacemaker  . Irregular heart beat Mother        pacemaker  . Hypertension Father     Social History   Socioeconomic History  . Marital status: Married    Spouse name: Judson Roch  . Number of children: Not on file  . Years of education: Not on file  . Highest education level: Not on file  Occupational History    Comment: [employer]  Tobacco Use  . Smoking status: Never Smoker  . Smokeless tobacco: Never Used  Vaping Use  . Vaping Use: Never used  Substance and Sexual Activity  . Alcohol use: Yes    Alcohol/week: 0.0 standard drinks    Comment: 2 per month  . Drug use: No  . Sexual activity: Yes    Partners: Female  Other Topics Concern  . Not on file  Social History Narrative   Some exercise, usually 3 times per week. occ caffeine but not daily.  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    Social Determinants of Health   Financial Resource Strain:   . Difficulty of Paying Living Expenses: Not on file  Food  Insecurity:   . Worried About Charity fundraiser in the Last Year: Not on file  . Ran Out of Food in the Last Year: Not on file  Transportation Needs:   . Lack of Transportation (Medical): Not on file  . Lack of Transportation (Non-Medical): Not on file  Physical Activity:   . Days of Exercise per Week: Not on file  . Minutes of Exercise per Session: Not on file  Stress:   . Feeling of Stress : Not on file  Social Connections:   . Frequency of Communication with Friends and Family: Not on file  . Frequency of Social Gatherings with Friends and Family: Not on file  . Attends Religious Services: Not on file  . Active Member of Clubs or Organizations: Not on file  . Attends Archivist Meetings: Not on file  . Marital Status: Not on file  Intimate Partner Violence:   . Fear of Current or Ex-Partner: Not on file  . Emotionally Abused: Not on file  . Physically Abused: Not on file  . Sexually Abused: Not on file    Outpatient Medications Prior to Visit  Medication  Sig Dispense Refill  . atorvastatin (LIPITOR) 10 MG tablet Take 1 tablet (10 mg total) by mouth daily. 90 tablet 3  . cetirizine (ZYRTEC) 10 MG tablet Take 1 tablet (10 mg total) by mouth daily. 365 tablet 0  . cholecalciferol (VITAMIN D) 1000 units tablet Take 5,000 Units by mouth daily.     . diclofenac sodium (VOLTAREN) 1 % GEL Apply 2 g topically 4 (four) times daily. To affected joint. 100 g 11  . finasteride (PROPECIA) 1 MG tablet Take 1 mg by mouth daily.    . finasteride (PROSCAR) 5 MG tablet Take 1/2 (one-half) tablet by mouth once daily 45 tablet 3  . fluticasone (FLONASE) 50 MCG/ACT nasal spray Place 2 sprays into both nostrils daily. 16 g 6   No facility-administered medications prior to visit.    Allergies  Allergen Reactions  . Simvastatin     REACTION: Myalgias    ROS Review of Systems    Objective:    Physical Exam Constitutional:      Appearance: He is well-developed.  HENT:      Head: Normocephalic and atraumatic.     Right Ear: External ear normal.     Left Ear: External ear normal.     Nose: Nose normal.  Eyes:     Conjunctiva/sclera: Conjunctivae normal.     Pupils: Pupils are equal, round, and reactive to light.  Neck:     Thyroid: No thyromegaly.  Cardiovascular:     Rate and Rhythm: Normal rate and regular rhythm.     Heart sounds: Normal heart sounds.  Pulmonary:     Effort: Pulmonary effort is normal.     Breath sounds: Normal breath sounds.  Abdominal:     General: Bowel sounds are normal. There is no distension.     Palpations: Abdomen is soft. There is no mass.     Tenderness: There is no abdominal tenderness. There is no guarding or rebound.  Musculoskeletal:        General: Normal range of motion.     Cervical back: Normal range of motion and neck supple.  Lymphadenopathy:     Cervical: No cervical adenopathy.  Skin:    General: Skin is warm and dry.  Neurological:     Mental Status: He is alert and oriented to person, place, and time.     Deep Tendon Reflexes: Reflexes are normal and symmetric.  Psychiatric:        Behavior: Behavior normal.        Thought Content: Thought content normal.        Judgment: Judgment normal.     BP 127/65   Pulse 75   Ht 5' 6"  (1.676 m)   Wt 145 lb (65.8 kg)   SpO2 98%   BMI 23.40 kg/m  Wt Readings from Last 3 Encounters:  10/10/20 145 lb (65.8 kg)  05/15/20 143 lb (64.9 kg)  12/25/19 150 lb (68 kg)     Health Maintenance Due  Topic Date Due  . Hepatitis C Screening  Never done    There are no preventive care reminders to display for this patient.  Lab Results  Component Value Date   TSH 1.04 12/09/2016   Lab Results  Component Value Date   WBC 6.8 10/13/2018   HGB 15.4 10/13/2018   HCT 45 09/03/2019   MCV 89.7 10/13/2018   PLT 277 09/03/2019   Lab Results  Component Value Date   NA 140 10/01/2020   K 4.2 10/01/2020  CO2 31 10/01/2020   GLUCOSE 117 (H) 10/01/2020   BUN 15  10/01/2020   CREATININE 0.80 10/01/2020   BILITOT 0.6 10/01/2020   ALKPHOS 76 09/03/2019   AST 14 10/01/2020   ALT 16 10/01/2020   PROT 6.7 10/01/2020   ALBUMIN 4.5 12/09/2016   CALCIUM 9.8 10/01/2020   Lab Results  Component Value Date   CHOL 174 10/01/2020   Lab Results  Component Value Date   HDL 46 10/01/2020   Lab Results  Component Value Date   LDLCALC 107 (H) 10/01/2020   Lab Results  Component Value Date   TRIG 117 10/01/2020   Lab Results  Component Value Date   CHOLHDL 3.8 10/01/2020   Lab Results  Component Value Date   HGBA1C 6.0 (H) 10/01/2020      Assessment & Plan:   Problem List Items Addressed This Visit    None    Visit Diagnoses    Wellness examination    -  Primary     Keep up a regular exercise program and make sure you are eating a healthy diet Try to eat 4 servings of dairy a day, or if you are lactose intolerant take a calcium with vitamin D daily.  Your vaccines are up to date.  Congratulated him on 15 pound weight loss he is really done phenomenally.  He was little disappointed his A1c had actually gone back up but just gave him encouragement at least it was stable.  And plan to recheck it again in 6 months.  No orders of the defined types were placed in this encounter.   Follow-up: No follow-ups on file.    Beatrice Lecher, MD

## 2020-10-26 LAB — COLOGUARD
COLOGUARD: NEGATIVE
Cologuard: NEGATIVE

## 2020-10-29 ENCOUNTER — Telehealth: Payer: Self-pay | Admitting: Family Medicine

## 2020-10-29 NOTE — Telephone Encounter (Signed)
Call pt: Cologuard is negative.  Good news.  Repeat colon cancer screening in  3 years.

## 2020-10-30 NOTE — Telephone Encounter (Signed)
Patient advised.

## 2021-01-13 NOTE — Progress Notes (Signed)
Great news! Your Cologuard test is negative.  Recommend repeat colon cancer screening in 3 years.

## 2021-04-10 ENCOUNTER — Ambulatory Visit: Payer: 59 | Admitting: Family Medicine

## 2021-04-24 ENCOUNTER — Ambulatory Visit: Payer: 59 | Admitting: Family Medicine

## 2021-05-07 ENCOUNTER — Telehealth: Payer: Self-pay | Admitting: General Practice

## 2021-05-07 NOTE — Telephone Encounter (Signed)
Transition Care Management Follow-up Telephone Call Date of discharge and from where: [facility] 05/06/21 How have you been since you were released from the hospital? Still sore but doing better than yesterday. Any questions or concerns? No  Items Reviewed: Did the pt receive and understand the discharge instructions provided? Yes  Medications obtained and verified? Yes  Other? No  Any new allergies since your discharge? No  Dietary orders reviewed? Yes Do you have support at home? Yes   Home Care and Equipment/Supplies: Were home health services ordered? no   Functional Questionnaire: (I = Independent and D = Dependent) ADLs: I  Bathing/Dressing- I  Meal Prep- I  Eating- I  Maintaining continence- I  Transferring/Ambulation- I  Managing Meds- I  Follow up appointments reviewed:  PCP Hospital f/u appt confirmed? No. Patient will call to schedule. Specialist Hospital f/u appt confirmed? No  Patient was referred only to PCP at this time. Are transportation arrangements needed? No  If their condition worsens, is the pt aware to call PCP or go to the Emergency Dept.? Yes Was the patient provided with contact information for the PCP's office or ED? Yes Was to pt encouraged to call back with questions or concerns? Yes

## 2021-05-12 ENCOUNTER — Encounter: Payer: Self-pay | Admitting: Family Medicine

## 2021-05-12 ENCOUNTER — Other Ambulatory Visit: Payer: Self-pay

## 2021-05-12 ENCOUNTER — Ambulatory Visit (INDEPENDENT_AMBULATORY_CARE_PROVIDER_SITE_OTHER): Payer: 59 | Admitting: Family Medicine

## 2021-05-12 DIAGNOSIS — R0789 Other chest pain: Secondary | ICD-10-CM
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
## 2021-05-12 NOTE — Progress Notes (Signed)
Pt reports that he has some L side collar bone soreness.

## 2021-05-27 ENCOUNTER — Other Ambulatory Visit: Payer: Self-pay | Admitting: Family Medicine

## 2021-05-27 DIAGNOSIS — E7800 Pure hypercholesterolemia, unspecified: Secondary | ICD-10-CM

## 2021-06-23 ENCOUNTER — Other Ambulatory Visit: Payer: Self-pay

## 2021-06-23 ENCOUNTER — Ambulatory Visit (INDEPENDENT_AMBULATORY_CARE_PROVIDER_SITE_OTHER): Payer: 59 | Admitting: Sports Medicine

## 2021-06-23 ENCOUNTER — Ambulatory Visit (INDEPENDENT_AMBULATORY_CARE_PROVIDER_SITE_OTHER): Payer: 59

## 2021-06-23 DIAGNOSIS — M21621 Bunionette of right foot: Secondary | ICD-10-CM | POA: Diagnosis not present

## 2021-06-23 MED ORDER — MELOXICAM 15 MG PO TABS: ORAL_TABLET | ORAL | 3 refills | Status: DC
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
## 2021-06-24 ENCOUNTER — Telehealth (INDEPENDENT_AMBULATORY_CARE_PROVIDER_SITE_OTHER): Payer: 59 | Admitting: Family Medicine

## 2021-06-24 ENCOUNTER — Encounter: Payer: Self-pay | Admitting: Family Medicine

## 2021-06-24 DIAGNOSIS — E7800 Pure hypercholesterolemia, unspecified: Secondary | ICD-10-CM

## 2021-06-24 DIAGNOSIS — R7301 Impaired fasting glucose: Secondary | ICD-10-CM | POA: Diagnosis not present

## 2021-06-24 DIAGNOSIS — M21621 Bunionette of right foot: Secondary | ICD-10-CM | POA: Diagnosis not present

## 2021-06-24 MED ORDER — ATORVASTATIN CALCIUM 10 MG PO TABS: 10.0000 mg | ORAL_TABLET | Freq: Every day | ORAL | 3 refills | Status: DC

## 2021-06-24 NOTE — Assessment & Plan Note (Signed)
A1c up slightly from previous.  So we discussed options including continuing to just put a little bit more diligence into diet and hopefully getting his foot better so he can start exercising regularly again.  In addition we discussed the option of metformin.  For now we will continue to monitor the A1c and repeat again in 6 months.  And at that point can add metformin if it staying elevated.

## 2021-06-24 NOTE — Progress Notes (Signed)
Virtual Visit via Video Note  I connected with George Young on 06/24/21 at  3:20 PM EDT by a video enabled telemedicine application and verified that I am speaking with the correct person using two identifiers.   I discussed the limitations of evaluation and management by telemedicine and the availability of in person appointments. The patient expressed understanding and agreed to proceed.  Patient location: at home Provider location: in office  Subjective:    CC: IFG  HPI:  Impaired fasting glucose-no increased thirst or urination. No symptoms consistent with hypoglycemia. Had labs done through work and last A1C was 6.2.  He was disappointed but hasn't been able to push his exercise like he would like bc of pain on the ball of his foot.  He saw partners, Dr. Benjamin Stain and was diagnosed with a bunionette.  He is being referred to Dr. Jordan Likes for inserts and is hoping that this will help so that he can start exercising more regularly.  He also asked about the shingles vaccine.  Did he is getting ready for a trip to [place] to visit his father for a week his father is 34.  Hyperlipidemia-he also had his lipids checked through work LDL was just slightly above 100 he used to be on 20 mg of atorvastatin at some 1 point we ended up decreasing his dose.  But he does tolerate the statin well without any problems or side effects.    Past medical history, Surgical history, Family history not pertinant except as noted below, Social history, Allergies, and medications have been entered into the medical record, reviewed, and corrections made.    Objective:    General: Speaking clearly in complete sentences without any shortness of breath.  Alert and oriented x3.  Normal judgment. No apparent acute distress.    Impression and Recommendations:    IFG (impaired fasting glucose) A1c up slightly from previous.  So we discussed options including continuing to just put a little bit more diligence  into diet and hopefully getting his foot better so he can start exercising regularly again.  In addition we discussed the option of metformin.  For now we will continue to monitor the A1c and repeat again in 6 months.  And at that point can add metformin if it staying elevated.  HYPERCHOLESTEROLEMIA, PURE Discussed goal of LDL less than 100.  Just slightly above therapeutic range currently.  We discussed possibly going back up on the atorvastatin for now he would like to stick with the 10 mg we can plan to recheck in 1 year.  Bunionette of right foot Awaiting: Phone call from Dr. Jordan Likes office for inserts.    No orders of the defined types were placed in this encounter.   Meds ordered this encounter  Medications   atorvastatin (LIPITOR) 10 MG tablet    Sig: Take 1 tablet (10 mg total) by mouth at bedtime.    Dispense:  90 tablet    Refill:  3    Requesting 1 year supply     I discussed the assessment and treatment plan with the patient. The patient was provided an opportunity to ask questions and all were answered. The patient agreed with the plan and demonstrated an understanding of the instructions.   The patient was advised to call back or seek an in-person evaluation if the symptoms worsen or if the condition fails to improve as anticipated.   Nani Gasser, MD

## 2021-06-24 NOTE — Assessment & Plan Note (Signed)
Discussed goal of LDL less than 100.  Just slightly above therapeutic range currently.  We discussed possibly going back up on the atorvastatin for now he would like to stick with the 10 mg we can plan to recheck in 1 year.

## 2021-06-24 NOTE — Assessment & Plan Note (Signed)
Awaiting: Phone call from Dr. Jordan Likes office for inserts.

## 2021-07-16 ENCOUNTER — Other Ambulatory Visit: Payer: Self-pay

## 2021-07-16 ENCOUNTER — Ambulatory Visit (INDEPENDENT_AMBULATORY_CARE_PROVIDER_SITE_OTHER): Payer: 59 | Admitting: Family Medicine

## 2021-07-16 DIAGNOSIS — M21621 Bunionette of right foot: Secondary | ICD-10-CM

## 2021-07-16 NOTE — Progress Notes (Signed)
  Dragon Thrush - 55 y.o. male MRN [id number]  Date of birth: Jun 11, 1966  SUBJECTIVE:  Including CC & ROS.  No chief complaint on file.   George Young is a 55 y.o. male that is presenting with right foot pain.  The pain is occurring over the lateral aspect.  It is worse with running.  He runs roughly 5 miles well exercising.    Review of Systems See HPI   HISTORY: Past Medical, Surgical, Social, and Family History Reviewed & Updated per EMR.   Pertinent Historical Findings include:  Past Medical History:  Diagnosis Date   Hiatal hernia    Hyperlipemia    Wrist fracture 2010    Past Surgical History:  Procedure Laterality Date   ESOPHAGOGASTRODUODENOSCOPY     HAIR TRANSPLANT     Dr. Denna Haggard   LASIK  08/2013   Right    Family History  Problem Relation Age of Onset   Hyperlipidemia Mother    Heart disease Mother        Has pacemaker   Irregular heart beat Mother        pacemaker   Hypertension Father     Social History   Socioeconomic History   Marital status: Married    Spouse name: Maralyn Sago   Number of children: 1   Years of education: Not on file   Highest education level: Not on file  Occupational History    Comment: [employer]  Tobacco Use   Smoking status: Never   Smokeless tobacco: Never  Vaping Use   Vaping Use: Never used  Substance and Sexual Activity   Alcohol use: Yes    Alcohol/week: 0.0 standard drinks    Comment: 2 per month   Drug use: No   Sexual activity: Yes    Partners: Female  Other Topics Concern   Not on file  Social History Narrative   Some exercise, usually 3 times per week. occ caffeine but not daily.  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    Social Determinants of Health   Financial Resource Strain: Not on file  Food Insecurity: Not on file  Transportation Needs: Not on file  Physical Activity: Not on file  Stress: Not on file  Social Connections: Not on file  Intimate Partner Violence: Not on file      PHYSICAL EXAM:  VS: Ht 5\' 6"  (1.676 m)   Wt 149 lb (67.6 kg)   BMI 24.05 kg/m  Physical Exam Gen: NAD, alert, cooperative with exam, well-appearing MSK:  Right and left foot: Fairly cavus foot. Loss of the transverse arch. Tenderness palpation over the fifth MTP joint. Neurovascular intact  Patient was fitted for a standard, cushioned, semi-rigid orthotic. The orthotic was heated and afterward the patient stood on the orthotic blank positioned on the orthotic stand. The patient was positioned in subtalar neutral position and 10 degrees of ankle dorsiflexion in a weight bearing stance. After completion of molding, a stable base was applied to the orthotic blank. The blank was ground to a stable position for weight bearing. Size: 9 Pairs: 2 Base: Blue EVA Additional Posting and Padding: Metatarsal pads and fifth ray post on the right The patient ambulated these, and they were very comfortable.   ASSESSMENT & PLAN:   Bunionette of right foot Having pain over the fifth MTP joint.  Does a fair amount of running. -Counseled on home exercise therapy and supportive care. -Orthotics today. -Could consider adding additional padding

## 2021-07-16 NOTE — Assessment & Plan Note (Signed)
Having pain over the fifth MTP joint.  Does a fair amount of running. -Counseled on home exercise therapy and supportive care. -Orthotics today. -Could consider adding additional padding

## 2021-07-18 ENCOUNTER — Ambulatory Visit: Payer: 59 | Admitting: Sports Medicine

## 2021-08-26 ENCOUNTER — Other Ambulatory Visit: Payer: Self-pay | Admitting: Family Medicine

## 2021-11-03 ENCOUNTER — Other Ambulatory Visit: Payer: Self-pay | Admitting: Family Medicine

## 2022-02-04 ENCOUNTER — Telehealth: Payer: Self-pay | Admitting: Family Medicine
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
## 2022-02-09 ENCOUNTER — Ambulatory Visit (INDEPENDENT_AMBULATORY_CARE_PROVIDER_SITE_OTHER): Payer: 59 | Admitting: Family Medicine

## 2022-02-09 ENCOUNTER — Other Ambulatory Visit: Payer: Self-pay

## 2022-02-09 VITALS — BP 142/80 | HR 82 | Temp 98.4°F | Ht 66.0 in | Wt 149.0 lb

## 2022-02-09 DIAGNOSIS — Z23 Encounter for immunization: Secondary | ICD-10-CM

## 2022-02-09 NOTE — Telephone Encounter (Signed)
Patient brought in form at nurse visit today.

## 2022-02-09 NOTE — Progress Notes (Signed)
Patient is here today for 1st shingles vaccine. Denies chest pain, shortness of breath, headaches and problems with medication or mood changes.   Patient tolerated injection well without complications.  Patient advised to schedule next injection in  3 months

## 2022-02-09 NOTE — Progress Notes (Signed)
Agree with documentation as above.   Ilai Hiller, MD  

## 2022-04-27 ENCOUNTER — Other Ambulatory Visit: Payer: Self-pay | Admitting: *Deleted

## 2022-04-27 DIAGNOSIS — E7800 Pure hypercholesterolemia, unspecified: Secondary | ICD-10-CM

## 2022-04-27 MED ORDER — ATORVASTATIN CALCIUM 10 MG PO TABS: 10.0000 mg | ORAL_TABLET | Freq: Every day | ORAL | 3 refills | Status: DC

## 2022-06-08 ENCOUNTER — Ambulatory Visit (INDEPENDENT_AMBULATORY_CARE_PROVIDER_SITE_OTHER): Payer: 59 | Admitting: Family Medicine

## 2022-06-08 VITALS — Temp 98.5°F

## 2022-06-08 DIAGNOSIS — Z23 Encounter for immunization: Secondary | ICD-10-CM | POA: Diagnosis not present

## 2022-06-09 NOTE — Progress Notes (Signed)
Agree with documentation as above.   Chrisma Hurlock, MD  

## 2022-06-12 ENCOUNTER — Ambulatory Visit: Payer: 59

## 2022-06-19 LAB — LIPID PANEL
Cholesterol: 222 — AB (ref 0–200)
HDL: 60 (ref 35–70)
LDL Cholesterol: 129
Triglycerides: 185 — AB (ref 40–160)

## 2022-06-19 LAB — BASIC METABOLIC PANEL: Glucose: 121

## 2022-06-19 LAB — HEMOGLOBIN A1C: Hemoglobin A1C: 6.2

## 2022-07-17 ENCOUNTER — Encounter: Payer: Self-pay | Admitting: Family Medicine

## 2022-07-17 ENCOUNTER — Ambulatory Visit (INDEPENDENT_AMBULATORY_CARE_PROVIDER_SITE_OTHER): Payer: 59 | Admitting: Family Medicine

## 2022-07-17 VITALS — BP 120/64 | HR 86 | Ht 65.5 in | Wt 147.0 lb

## 2022-07-17 DIAGNOSIS — Z1159 Encounter for screening for other viral diseases: Secondary | ICD-10-CM | POA: Diagnosis not present

## 2022-07-17 DIAGNOSIS — Z0000 Encounter for general adult medical examination without abnormal findings: Secondary | ICD-10-CM

## 2022-07-17 DIAGNOSIS — Z125 Encounter for screening for malignant neoplasm of prostate: Secondary | ICD-10-CM

## 2022-07-17 NOTE — Progress Notes (Signed)
Complete physical exam  Patient: George Young   DOB: 26-Mar-1966   56 y.o. Male  MRN: [id number]  Subjective:    Chief Complaint  Patient presents with   Annual Exam    George Young is a 56 y.o. male who presents today for a complete physical exam. He reports consuming a general diet. He was hoping A1C would be down.   Working out daily for about 45 minutes.   He generally feels well. He reports sleeping fair but often wakes up in the middle of the night and can't go back to sleep. He does not have additional problems to discuss today. His uncle passed away last week. And his dog is having some health issues.     Most recent fall risk assessment:    07/17/2022   10:52 AM  Fall Risk   Falls in the past year? 0  Number falls in past yr: 0  Injury with Fall? 0  Risk for fall due to : No Fall Risks  Follow up Falls prevention discussed     Most recent depression screenings:    07/17/2022   12:37 PM 05/12/2021    8:42 AM  PHQ 2/9 Scores  PHQ - 2 Score 2 1  PHQ- 9 Score 5       Past Surgical History:  Procedure Laterality Date   ESOPHAGOGASTRODUODENOSCOPY     HAIR TRANSPLANT     Dr. Eual Fines   LASIK  08/2013   Right   Social History   Tobacco Use   Smoking status: Never   Smokeless tobacco: Never  Vaping Use   Vaping Use: Never used  Substance Use Topics   Alcohol use: Yes    Alcohol/week: 0.0 standard drinks of alcohol    Comment: 2 per month   Drug use: No   Allergies  Allergen Reactions   Simvastatin     REACTION: Myalgias      Patient Care Team: Hali Marry, MD as PCP - General   Outpatient Medications Prior to Visit  Medication Sig   atorvastatin (LIPITOR) 10 MG tablet Take 1 tablet (10 mg total) by mouth at bedtime.   cetirizine (ZYRTEC) 10 MG tablet Take 1 tablet (10 mg total) by mouth daily.   cholecalciferol (VITAMIN D) 1000 units tablet Take 5,000 Units by mouth daily.    finasteride (PROSCAR) 5 MG tablet Take 1/2 (one-half)  tablet by mouth once daily   montelukast (SINGULAIR) 10 MG tablet Take 1 tablet by mouth daily.   No facility-administered medications prior to visit.    ROS        Objective:     BP 120/64   Pulse 86   Ht 5' 5.5" (1.664 m)   Wt 147 lb (66.7 kg)   SpO2 98%   BMI 24.09 kg/m     Physical Exam Constitutional:      Appearance: He is well-developed.  HENT:     Head: Normocephalic and atraumatic.     Right Ear: Tympanic membrane, ear canal and external ear normal.     Left Ear: Tympanic membrane, ear canal and external ear normal.     Nose: Nose normal.     Mouth/Throat:     Pharynx: Oropharynx is clear.  Eyes:     Conjunctiva/sclera: Conjunctivae normal.     Pupils: Pupils are equal, round, and reactive to light.  Neck:     Thyroid: No thyromegaly.  Cardiovascular:     Rate and Rhythm: Normal rate and  regular rhythm.     Heart sounds: Normal heart sounds.  Pulmonary:     Effort: Pulmonary effort is normal.     Breath sounds: Normal breath sounds.  Abdominal:     General: Bowel sounds are normal. There is no distension.     Palpations: Abdomen is soft. There is no mass.     Tenderness: There is no abdominal tenderness. There is no guarding or rebound.  Musculoskeletal:        General: Normal range of motion.     Cervical back: Normal range of motion and neck supple.  Lymphadenopathy:     Cervical: No cervical adenopathy.  Skin:    General: Skin is warm and dry.  Neurological:     Mental Status: He is alert and oriented to person, place, and time.     Deep Tendon Reflexes: Reflexes are normal and symmetric.  Psychiatric:        Behavior: Behavior normal.        Thought Content: Thought content normal.        Judgment: Judgment normal.      Results for orders placed or performed in visit on 25/00/37  Basic metabolic panel  Result Value Ref Range   Glucose 121   Lipid panel  Result Value Ref Range   Triglycerides 185 (A) 40 - 160   Cholesterol 222 (A) 0  - 200   HDL 60 35 - 70   LDL Cholesterol 129   Hemoglobin A1c  Result Value Ref Range   Hemoglobin A1C 6.2         Assessment & Plan:    Routine Health Maintenance and Physical Exam  Immunization History  Administered Date(s) Administered   Hepatitis B 05/17/2010, 10/18/2010, 12/15/2010   MMR 08/09/2002, 09/29/2002   PFIZER Comirnaty(Gray Top)Covid-19 Tri-Sucrose Vaccine 03/06/2020, 03/27/2020, 10/11/2020, 06/10/2021   PFIZER(Purple Top)SARS-COV-2 Vaccination 02/28/2020, 03/20/2020   PPD Test 08/09/2002, 04/15/2010   Tdap 08/09/2002, 09/29/2002, 12/28/2006, 10/13/2017   Zoster Recombinat (Shingrix) 02/09/2022, 06/08/2022    Health Maintenance  Topic Date Due   Hepatitis C Screening  Never done   INFLUENZA VACCINE  03/27/2024 (Originally 07/28/2022)   Fecal DNA (Cologuard)  10/18/2023   TETANUS/TDAP  10/14/2027   COVID-19 Vaccine  Completed   HIV Screening  Completed   Zoster Vaccines- Shingrix  Completed   Pneumococcal Vaccine 20-97 Years old  Aged Out   HPV VACCINES  Aged Out    Discussed health benefits of physical activity, and encouraged him to engage in regular exercise appropriate for his age and condition.  Problem List Items Addressed This Visit   None Visit Diagnoses     Wellness examination    -  Primary   Relevant Orders   Hepatitis C Antibody   COMPLETE METABOLIC PANEL WITH GFR   Vitamin D (25 hydroxy)   B12   PSA   CBC   Need for hepatitis C screening test       Relevant Orders   Hepatitis C Antibody   Screening PSA (prostate specific antigen)       Relevant Orders   PSA       Keep up a regular exercise program and make sure you are eating a healthy diet Try to eat 4 servings of dairy a day, or if you are lactose intolerant take a calcium with vitamin D daily.  Your vaccines are up to date.   No follow-ups on file.     Beatrice Lecher, MD

## 2022-07-20 LAB — COMPLETE METABOLIC PANEL WITH GFR
AG Ratio: 2.4 (calc) (ref 1.0–2.5)
ALT: 18 U/L (ref 9–46)
AST: 15 U/L (ref 10–35)
Albumin: 5 g/dL (ref 3.6–5.1)
Alkaline phosphatase (APISO): 76 U/L (ref 35–144)
BUN: 15 mg/dL (ref 7–25)
CO2: 27 mmol/L (ref 20–32)
Calcium: 10 mg/dL (ref 8.6–10.3)
Chloride: 101 mmol/L (ref 98–110)
Creat: 0.91 mg/dL (ref 0.70–1.30)
Globulin: 2.1 g/dL (calc) (ref 1.9–3.7)
Glucose, Bld: 106 mg/dL — ABNORMAL HIGH (ref 65–99)
Potassium: 4.6 mmol/L (ref 3.5–5.3)
Sodium: 139 mmol/L (ref 135–146)
Total Bilirubin: 0.4 mg/dL (ref 0.2–1.2)
Total Protein: 7.1 g/dL (ref 6.1–8.1)
eGFR: 100 mL/min/{1.73_m2} (ref >=60)

## 2022-07-20 LAB — VITAMIN D 25 HYDROXY (VIT D DEFICIENCY, FRACTURES): Vit D, 25-Hydroxy: 54 ng/mL (ref 30–100)

## 2022-07-20 LAB — VITAMIN B12: Vitamin B-12: 312 pg/mL (ref 200–1100)

## 2022-07-20 LAB — CBC
HCT: 47.2 % (ref 38.5–50.0)
Hemoglobin: 15.8 g/dL (ref 13.2–17.1)
MCH: 31.4 pg (ref 27.0–33.0)
MCHC: 33.5 g/dL (ref 32.0–36.0)
MCV: 93.8 fL (ref 80.0–100.0)
MPV: 11.6 fL (ref 7.5–12.5)
Platelets: 254 10*3/uL (ref 140–400)
RBC: 5.03 10*6/uL (ref 4.20–5.80)
RDW: 12.6 % (ref 11.0–15.0)
WBC: 5.2 10*3/uL (ref 3.8–10.8)

## 2022-07-20 LAB — HEPATITIS C ANTIBODY: Hepatitis C Ab: NONREACTIVE

## 2022-07-20 LAB — PSA: PSA: 1.36 ng/mL (ref <=4.00)

## 2022-07-20 NOTE — Progress Notes (Signed)
Hi George Young, complete metabolic panel is normal.  Negative hepatitis C.  Vitamin D looks much better.  Up to 36 which is awesome.  Make sure to continue taking your vitamin D3 fall and winter.  Vitamin B12 is normal as well.  Blood count and prostate test are also normal.

## 2022-11-20 ENCOUNTER — Other Ambulatory Visit: Payer: Self-pay | Admitting: Family Medicine

## 2023-03-09 ENCOUNTER — Ambulatory Visit
Admission: RE | Admit: 2023-03-09 | Discharge: 2023-03-09 | Disposition: A | Discharge status: Home / Self Care | Payer: 59 | Source: Ambulatory Visit | Attending: Family Medicine | Admitting: Family Medicine

## 2023-03-09 VITALS — BP 150/82 | HR 83 | Temp 98.2°F | Resp 16

## 2023-03-09 DIAGNOSIS — J069 Acute upper respiratory infection, unspecified: Secondary | ICD-10-CM

## 2023-03-09 DIAGNOSIS — J029 Acute pharyngitis, unspecified: Secondary | ICD-10-CM | POA: Diagnosis not present

## 2023-03-09 MED ORDER — PREDNISONE 20 MG PO TABS: 40.0000 mg | ORAL_TABLET | Freq: Every day | ORAL | 0 refills | Status: DC

## 2023-03-09 NOTE — ED Triage Notes (Signed)
Saturday began with an itchy throat, has progressed into nasal congestion, headache, and now a sore throat. Taking coricidin at home to help him sleep, as well as alternating ibuprofen and tylenol to help with pain.

## 2023-03-09 NOTE — Discharge Instructions (Signed)
Drink lots of water May take over-the-counter cough and cold medicine as needed Take prednisone daily for 5 days See your doctor if not improving by next week

## 2023-03-09 NOTE — ED Provider Notes (Signed)
[facility]    CSN: [id number] Arrival date & time: 03/09/23  1250      History   Chief Complaint Chief Complaint  Patient presents with   Sore Throat    I have been experiencing a lot of congestion in my head, nose, throat, and constant headache. - Entered by patient    HPI George Young is a 57 y.o. male.   HPI  Patient has 2 days of runny stuffy nose, ear pressure and pain, sore throat, mild cough.  He did a COVID test at home that was negative.  He is here for evaluation.  He does have some underlying allergies.  He takes allergy shots and allergy medicines.  Past Medical History:  Diagnosis Date   Hiatal hernia    Hyperlipemia    Wrist fracture 2010    Patient Active Problem List   Diagnosis Date Noted   Bunionette of right foot 06/23/2021   Tendinitis of long head of biceps brachii of left shoulder 09/29/2019   Subacromial bursitis of left shoulder joint 09/29/2019   Ganglion cyst 11/03/2013   Hiatal hernia 11/11/2012   IFG (impaired fasting glucose) 09/26/2007   HYPERCHOLESTEROLEMIA, PURE 07/12/2007    Past Surgical History:  Procedure Laterality Date   ESOPHAGOGASTRODUODENOSCOPY     HAIR TRANSPLANT     Dr. Eual Fines   LASIK  08/2013   Right       Home Medications    Prior to Admission medications   Medication Sig Start Date End Date Taking? Authorizing Provider  atorvastatin (LIPITOR) 10 MG tablet Take 1 tablet (10 mg total) by mouth at bedtime. 04/27/22  Yes Hali Marry, MD  cetirizine (ZYRTEC) 10 MG tablet Take 1 tablet (10 mg total) by mouth daily. 02/04/16  Yes Hali Marry, MD  cholecalciferol (VITAMIN D) 1000 units tablet Take 5,000 Units by mouth daily.    Yes [provider]  finasteride (PROSCAR) 5 MG tablet Take 1/2 (one-half) tablet by mouth once daily 11/23/22  Yes Hali Marry, MD  predniSONE (DELTASONE) 20 MG tablet Take 2 tablets (40 mg total) by mouth daily with breakfast. 03/09/23   Yes Raylene Everts, MD    Family History Family History  Problem Relation Age of Onset   Hyperlipidemia Mother    Heart disease Mother        Has pacemaker   Irregular heart beat Mother        pacemaker   Hypertension Father     Social History Social History   Tobacco Use   Smoking status: Never   Smokeless tobacco: Never  Vaping Use   Vaping Use: Never used  Substance Use Topics   Alcohol use: Yes    Alcohol/week: 0.0 standard drinks of alcohol    Comment: 2 per month   Drug use: No     Allergies   Simvastatin   Review of Systems Review of Systems See HPI  Physical Exam Triage Vital Signs ED Triage Vitals  Enc Vitals Group     BP 03/09/23 1257 (!) 150/82     Pulse Rate 03/09/23 1257 83     Resp 03/09/23 1257 16     Temp 03/09/23 1257 98.2 F (36.8 C)     Temp Source 03/09/23 1257 Oral     SpO2 03/09/23 1257 100 %     Weight --      Height --      Head Circumference --      Peak  Flow --      Pain Score 03/09/23 1258 5     Pain Loc --      Pain Edu? --      Excl. in [place]? --    No data found.  Updated Vital Signs BP (!) 150/82 (BP Location: Left Arm)   Pulse 83   Temp 98.2 F (36.8 C) (Oral)   Resp 16   SpO2 100%      Physical Exam Constitutional:      General: He is not in acute distress.    Appearance: He is well-developed. He is not ill-appearing.  HENT:     Head: Normocephalic and atraumatic.     Right Ear: Tympanic membrane and ear canal normal.     Left Ear: Tympanic membrane and ear canal normal.     Nose: Congestion present.     Comments: Nasal congestion, clear rhinorrhea    Mouth/Throat:     Pharynx: Uvula midline. Posterior oropharyngeal erythema present.     Tonsils: No tonsillar exudate. 1+ on the right. 1+ on the left.     Comments: Mild posterior pharynx erythema Eyes:     Conjunctiva/sclera: Conjunctivae normal.     Pupils: Pupils are equal, round, and reactive to light.  Cardiovascular:     Rate and Rhythm:  Normal rate.  Pulmonary:     Effort: Pulmonary effort is normal. No respiratory distress.  Abdominal:     General: There is no distension.     Palpations: Abdomen is soft.  Musculoskeletal:        General: Normal range of motion.     Cervical back: Normal range of motion.  Skin:    General: Skin is warm and dry.  Neurological:     Mental Status: He is alert.      UC Treatments / Results  Labs (all labs ordered are listed, but only abnormal results are displayed) Labs Reviewed - No data to display  EKG   Radiology No results found.  Procedures Procedures (including critical care time)  Medications Ordered in UC Medications - No data to display  Initial Impression / Assessment and Plan / UC Course  I have reviewed the triage vital signs and the nursing notes.  Pertinent labs & imaging results that were available during my care of the patient were reviewed by me and considered in my medical decision making (see chart for details).     Final Clinical Impressions(s) / UC Diagnoses   Final diagnoses:  Viral pharyngitis  Viral upper respiratory tract infection     Discharge Instructions      Drink lots of water May take over-the-counter cough and cold medicine as needed Take prednisone daily for 5 days See your doctor if not improving by next week   ED Prescriptions     Medication Sig Dispense Auth. Provider   predniSONE (DELTASONE) 20 MG tablet Take 2 tablets (40 mg total) by mouth daily with breakfast. 10 tablet Raylene Everts, MD      PDMP not reviewed this encounter.   Raylene Everts, MD 03/09/23 [phone]
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
## 2023-04-12 ENCOUNTER — Encounter: Payer: Self-pay | Admitting: *Deleted

## 2023-04-18 ENCOUNTER — Other Ambulatory Visit: Payer: Self-pay | Admitting: Family Medicine

## 2023-04-18 DIAGNOSIS — E7800 Pure hypercholesterolemia, unspecified: Secondary | ICD-10-CM

## 2023-07-23 ENCOUNTER — Encounter: Payer: 59 | Admitting: Family Medicine

## 2023-08-06 ENCOUNTER — Encounter: Payer: Self-pay | Admitting: Family Medicine

## 2023-08-06 ENCOUNTER — Ambulatory Visit (INDEPENDENT_AMBULATORY_CARE_PROVIDER_SITE_OTHER): Payer: 59 | Admitting: Family Medicine

## 2023-08-06 VITALS — BP 132/79 | HR 92 | Ht 65.5 in | Wt 147.0 lb

## 2023-08-06 DIAGNOSIS — Z0000 Encounter for general adult medical examination without abnormal findings: Secondary | ICD-10-CM | POA: Diagnosis not present

## 2023-08-06 DIAGNOSIS — M79672 Pain in left foot: Secondary | ICD-10-CM | POA: Diagnosis not present

## 2023-08-06 DIAGNOSIS — M79671 Pain in right foot: Secondary | ICD-10-CM | POA: Diagnosis not present

## 2023-08-06 DIAGNOSIS — K449 Diaphragmatic hernia without obstruction or gangrene: Secondary | ICD-10-CM | POA: Diagnosis not present

## 2023-08-06 DIAGNOSIS — M25521 Pain in right elbow: Secondary | ICD-10-CM

## 2023-08-06 MED ORDER — PREDNISONE 20 MG PO TABS: 40.0000 mg | ORAL_TABLET | Freq: Every day | ORAL | 0 refills | Status: DC

## 2023-08-06 NOTE — Progress Notes (Signed)
Complete physical exam  Patient: George Young   DOB: 06-28-1966   57 y.o. Male  MRN: [id number]  Subjective:    Chief Complaint  Patient presents with   Annual Exam    George Young is a 57 y.o. male who presents today for a complete physical exam. He reports consuming a general diet.  He is exercising, going to the gym and walking doing strength training.   He generally feels well. [name] He does have additional problems to discuss today.   He wanted to let me know that he has been having some pain in his right forearm just distal to the elbow.  He said initially it was a tingling sensation and then it became more painful.  No known injury or trauma but he does work on the computer a lot.  He said initially he tried to ice it and wear an elbow strap and thought maybe it was helping some.  He says since I last saw him his father passed away from a stroke and his maternal uncle died from liver cancer and he lost his dog of 14 years in February.  So he says it has been a struggle his mother who is in her 90s is actually doing fairly well.  He would also like to get back in to have additional custom orthotics made he had some made for his feet a couple years ago with one of our sports med docs in [facility] who is no longer there and would like to get new ones done.  He also feels like he has been having more problems with his hiatal hernia recently.  He was diagnosed years ago and took a PPI for about a year but felt like it really was not helpful ended up making significant dietary changes and felt like that was more helpful than the PPI but more recently he has been struggling with increased symptoms particularly with some epigastric and left upper quadrant pain.  He says he never really experiences heartburn.  Most recent fall risk assessment:    08/06/2023    3:25 PM  Fall Risk   Falls in the past year? 0  Number falls in past yr: 0  Injury with Fall? 0  Risk for fall due to : No Fall  Risks  Follow up Falls evaluation completed     Most recent depression screenings:    08/06/2023    3:25 PM 07/17/2022   12:37 PM  PHQ 2/9 Scores  PHQ - 2 Score 0 2  PHQ- 9 Score  5        Patient Care Team: Agapito Games, MD as PCP - General   Outpatient Medications Prior to Visit  Medication Sig   AMBULATORY NON FORMULARY MEDICATION Medication Name: hims hair regrowth   atorvastatin (LIPITOR) 10 MG tablet TAKE 1 TABLET AT BEDTIME   cetirizine (ZYRTEC) 10 MG tablet Take 1 tablet (10 mg total) by mouth daily.   cholecalciferol (VITAMIN D) 1000 units tablet Take 5,000 Units by mouth daily.    [DISCONTINUED] finasteride (PROSCAR) 5 MG tablet Take 1/2 (one-half) tablet by mouth once daily   [DISCONTINUED] predniSONE (DELTASONE) 20 MG tablet Take 2 tablets (40 mg total) by mouth daily with breakfast.   No facility-administered medications prior to visit.    ROS        Objective:     BP 132/79   Pulse 92   Ht 5' 5.5" (1.664 m)   Wt 147 lb (66.7  kg)   SpO2 97%   BMI 24.09 kg/m    Physical Exam Constitutional:      Appearance: He is well-developed.  HENT:     Head: Normocephalic and atraumatic.     Right Ear: Tympanic membrane, ear canal and external ear normal.     Left Ear: Tympanic membrane, ear canal and external ear normal.     Nose: Nose normal.     Mouth/Throat:     Pharynx: Oropharynx is clear.  Eyes:     Conjunctiva/sclera: Conjunctivae normal.     Pupils: Pupils are equal, round, and reactive to light.  Neck:     Thyroid: No thyromegaly.  Cardiovascular:     Rate and Rhythm: Normal rate and regular rhythm.     Heart sounds: Normal heart sounds.  Pulmonary:     Effort: Pulmonary effort is normal.     Breath sounds: Normal breath sounds.  Abdominal:     General: Bowel sounds are normal. There is no distension.     Palpations: Abdomen is soft. There is no mass.     Tenderness: There is no abdominal tenderness. There is no guarding or  rebound.  Musculoskeletal:        General: Normal range of motion.     Cervical back: Normal range of motion and neck supple. No tenderness.  Lymphadenopathy:     Cervical: No cervical adenopathy.  Skin:    General: Skin is warm and dry.  Neurological:     Mental Status: He is alert and oriented to person, place, and time.     Deep Tendon Reflexes: Reflexes are normal and symmetric.  Psychiatric:        Behavior: Behavior normal.        Thought Content: Thought content normal.        Judgment: Judgment normal.      No results found for any visits on 08/06/23.     Assessment & Plan:    Routine Health Maintenance and Physical Exam  Immunization History  Administered Date(s) Administered   COVID-19, mRNA, vaccine(Comirnaty)12 years and older 10/12/2022   Hepatitis B 05/17/2010, 10/18/2010, 12/15/2010   MMR 08/09/2002, 09/29/2002   PFIZER Comirnaty(Gray Top)Covid-19 Tri-Sucrose Vaccine 03/06/2020, 03/27/2020, 10/11/2020, 06/10/2021   PFIZER(Purple Top)SARS-COV-2 Vaccination 02/28/2020, 03/20/2020   PPD Test 08/09/2002, 04/15/2010   Tdap 08/09/2002, 09/29/2002, 12/28/2006, 10/13/2017   Zoster Recombinant(Shingrix) 02/09/2022, 06/08/2022    Health Maintenance  Topic Date Due   INFLUENZA VACCINE  03/27/2024 (Originally 07/29/2023)   Fecal DNA (Cologuard)  10/18/2023   DTaP/Tdap/Td (5 - Td or Tdap) 10/14/2027   COVID-19 Vaccine  Completed   Hepatitis C Screening  Completed   HIV Screening  Completed   Zoster Vaccines- Shingrix  Completed   Pneumococcal Vaccine 25-76 Years old  Aged Out   HPV VACCINES  Aged Out    Discussed health benefits of physical activity, and encouraged him to engage in regular exercise appropriate for his age and condition.  Problem List Items Addressed This Visit       Respiratory   Hiatal hernia   Relevant Orders   Ambulatory referral to Gastroenterology   Other Visit Diagnoses     Wellness examination    -  Primary   Right elbow pain        Pain in both feet       Relevant Orders   Ambulatory referral to Sports Medicine      Return in about 1 year (around 08/05/2024).   Keep up  a regular exercise program and make sure you are eating a healthy diet Try to eat 4 servings of dairy a day, or if you are lactose intolerant take a calcium with vitamin D daily.  Your vaccines are up to date.   Hiatal hernia-discussed referral to Dr. Barron Alvine for further evaluations especially since it has been years since he has been evaluated and has had some increasing symptoms recently.  Evidently they had even talked about the potential for surgical repair when he first had it evaluated.  He is not currently on a PPI does occasionally take a Pepcid AC and has adhered to his dietary changes which have helped.  Right elbow and forearm pain most consistent with possible radial tunnel syndrome.  Less consistent with lateral epicondylitis.  No significant pain with supination or pronation against resistance.  Recommend a round of prednisone and given some stretches and exercises to do on his own at home.  If not improving over the next several weeks will schedule with sports med for further workup.  In regards to custom orthotics we will try to get him referred to either sports med or podiatry.  He did bring in a copy of recent labs his A1c was 6.3 still in the prediabetes range we will continue to work on healthy diet and regular exercise and plan to recheck in 6 months.    Nani Gasser, MD

## 2023-08-12 ENCOUNTER — Telehealth: Payer: Self-pay | Admitting: Family Medicine

## 2023-08-12 NOTE — Telephone Encounter (Signed)
Patient called. Earliest he can get in with Laurette Schimke is early November. Patient states that he was told by that office if they got a call from [place] stating that it is Urgent that he be seen, they will work him in.

## 2023-08-16 ENCOUNTER — Telehealth: Payer: Self-pay | Admitting: Family Medicine

## 2023-08-16 NOTE — Telephone Encounter (Signed)
Patient called in stating that he spoke to [facility] and they stated he could not be seen until November. Patient states that he could not wait until November, Gastro recommended that he urgent request/note from provider office to fit him in earlier. Please advise.

## 2023-08-20 ENCOUNTER — Encounter: Payer: Self-pay | Admitting: Family Medicine

## 2023-08-20 ENCOUNTER — Ambulatory Visit (INDEPENDENT_AMBULATORY_CARE_PROVIDER_SITE_OTHER): Payer: 59 | Admitting: Family Medicine

## 2023-08-20 VITALS — BP 130/70 | Ht 66.0 in | Wt 145.0 lb

## 2023-08-20 DIAGNOSIS — M21621 Bunionette of right foot: Secondary | ICD-10-CM | POA: Diagnosis not present

## 2023-08-20 NOTE — Progress Notes (Unsigned)
  George Young - 57 y.o. male MRN [id number]  Date of birth: 11-04-1966  PCP: Agapito Games, MD  Subjective:  No chief complaint on file. Right greater than left foot pain  HPI: Past Medical, Surgical, Social, and Family History Reviewed & Updated per EMR.   Patient is a 57 y.o. male here for severity: Moderate, timing: constant, unchanged, localized, pain located over the right lateral foot that started 2 weeks ago after he thinks the padding on his custom orthotic shifted.. It is exacerbated by walking/running and alleviated by rest.  His orthotics have given him good relief up to this point.  But the padding under his forefoot has worn down.  He is not have any numbness or swelling of the feet, and no falls.  Past Medical History:  Diagnosis Date   Hiatal hernia    Hyperlipemia    Wrist fracture 2010    Current Outpatient Medications on File Prior to Visit  Medication Sig Dispense Refill   AMBULATORY NON FORMULARY MEDICATION Medication Name: hims hair regrowth     atorvastatin (LIPITOR) 10 MG tablet TAKE 1 TABLET AT BEDTIME 90 tablet 3   cetirizine (ZYRTEC) 10 MG tablet Take 1 tablet (10 mg total) by mouth daily. 365 tablet 0   cholecalciferol (VITAMIN D) 1000 units tablet Take 5,000 Units by mouth daily.      predniSONE (DELTASONE) 20 MG tablet Take 2 tablets (40 mg total) by mouth daily with breakfast. 10 tablet 0   No current facility-administered medications on file prior to visit.    Past Surgical History:  Procedure Laterality Date   ESOPHAGOGASTRODUODENOSCOPY     HAIR TRANSPLANT     Dr. Denna Haggard   LASIK  08/2013   Right    Allergies  Allergen Reactions   Simvastatin     REACTION: Myalgias        Objective:  Physical Exam: VS: BP:130/70  HR: bpm  TEMP: ( )  RESP:   HT:5\' 6"  (167.6 cm)   WT:145 lb (65.8 kg)  BMI:23.41  Gen: NAD, speaks clearly, comfortable in exam room Respiratory: normal work of breathing on room air Skin: No rashes,  abrasions, or ecchymosis MSK:  Observation of the feet bilaterally shows a wide forefoot, with weightbearing more on the fifth metatarsal right greater than left.  Gait is normal with some dynamic pronation.  He has dynamic loss of his transverse arch bilaterally as well as some pronation when standing and dynamically.  He has full range of motion of all digits, and nontender to palpation over the MP joints.  There is no edema or tenderness over the fifth metatarsal bilaterally.  No sensory deficits, dorsal pedis pulses equal bilaterally.    Assessment & Plan:   Bunionette of right foot - Kimo's inserts from 2022 have been helping immensely.  It appears that he just had some shifting of the fifth ray post and wear down of the metatarsal pad for the loss of his transverse arch on the right foot. - Fifth ray post was tacked down and a new metatarsal pad was added on the right side.  He reports this feels much better. - We will see him back as needed.    Rica Mote MD [facility] Sports Medicine Fellow  Addendum:  Patient seen in the office by fellow.  His history, exam, plan of care were precepted with me.  Norton Blizzard MD Marrianne Mood

## 2023-08-20 NOTE — Assessment & Plan Note (Signed)
-   George Young's inserts from 2022 have been helping immensely.  It appears that he just had some shifting of the fifth ray post and wear down of the metatarsal pad for the loss of his transverse arch on the right foot. - Fifth ray post was tacked down and a new metatarsal pad was added on the right side.  He reports this feels much better. - We will see him back as needed.

## 2023-08-23 ENCOUNTER — Telehealth: Payer: Self-pay | Admitting: Family Medicine

## 2023-08-23 ENCOUNTER — Encounter: Payer: Self-pay | Admitting: Family Medicine

## 2023-08-23 NOTE — Telephone Encounter (Signed)
Patient called and is following up on referral for gastrology he ask that you if you could call him with an up date please [phone]

## 2023-08-24 ENCOUNTER — Telehealth: Payer: Self-pay | Admitting: Family Medicine

## 2023-08-24 ENCOUNTER — Encounter: Payer: Self-pay | Admitting: Family Medicine

## 2023-08-24 DIAGNOSIS — Z1211 Encounter for screening for malignant neoplasm of colon: Secondary | ICD-10-CM

## 2023-08-24 NOTE — Telephone Encounter (Signed)
Patient called in wanting a colorguard test ordered. Please Advise

## 2023-08-25 NOTE — Telephone Encounter (Signed)
Contacted [facility] ( Spoke with Asher Muir) Gastroenterology Referral Appointment for  09/02/2023 at 9:45 am with Dr. Lenore Manner. This appointment will be located at  [facility] [address]  [address] [place], [place] [address]  Ph: [phone]

## 2023-08-25 NOTE — Telephone Encounter (Signed)
Pt. Aware of upcoming appointment and message sent through mychart.

## 2023-09-10 NOTE — Telephone Encounter (Signed)
Cologuard has been ordered.

## 2023-09-10 NOTE — Telephone Encounter (Signed)
Patient seen 9 5 will close encounter.

## 2023-09-20 LAB — HM COLONOSCOPY

## 2024-05-02 ENCOUNTER — Other Ambulatory Visit: Payer: Self-pay

## 2024-05-02 DIAGNOSIS — E7800 Pure hypercholesterolemia, unspecified: Secondary | ICD-10-CM

## 2024-05-02 MED ORDER — ATORVASTATIN CALCIUM 10 MG PO TABS
10.0000 mg | ORAL_TABLET | Freq: Every day | ORAL | 3 refills | Status: DC
Start: 2024-05-02 — End: 2024-09-04

## 2024-05-02 NOTE — Telephone Encounter (Signed)
 Copied from CRM [id number]. Topic: Clinical - Prescription Issue >> May 01, 2024  4:15 PM Brynn Caras wrote: Reason for CRM: The patient was advised by his pharmacy - [facility] to contact his PCP for a refill of atorvastatin  (LIPITOR) 10 MG tablet. The patient would like to confirm if he will need to schedule an office visit to refill this prescription? Callback #:[phone]

## 2024-05-02 NOTE — Telephone Encounter (Signed)
 Requesting rx rf of atorvastatin  10mg   Last written 04/19/2023 Last OV 08/09/224 Last Lipid panel 06/19/2022 Upcoming appt = none

## 2024-07-01 LAB — LIPID PANEL
Cholesterol: 194 (ref 0–200)
HDL: 56 (ref 35–70)
LDL Cholesterol: 116
Triglycerides: 125 (ref 40–160)

## 2024-07-01 LAB — BASIC METABOLIC PANEL WITH GFR: Glucose: 118

## 2024-07-01 LAB — HEMOGLOBIN A1C: Hemoglobin A1C: 6.2

## 2024-07-31 ENCOUNTER — Telehealth: Payer: Self-pay

## 2024-07-31 DIAGNOSIS — Z125 Encounter for screening for malignant neoplasm of prostate: Secondary | ICD-10-CM

## 2024-07-31 DIAGNOSIS — E7800 Pure hypercholesterolemia, unspecified: Secondary | ICD-10-CM

## 2024-07-31 DIAGNOSIS — R7301 Impaired fasting glucose: Secondary | ICD-10-CM

## 2024-07-31 NOTE — Telephone Encounter (Signed)
 Pended labs. Appointment 09/07/2024

## 2024-07-31 NOTE — Telephone Encounter (Signed)
 Copied from CRM [id number]. Topic: Clinical - Request for Lab/Test Order >> Jul 31, 2024  9:11 AM Merlynn A wrote: Reason for CRM: Patient called in requesting lab order to be placed for full panel work up. Please place order for patient.

## 2024-08-02 NOTE — Telephone Encounter (Signed)
 Lvm advising pt of labs.

## 2024-08-02 NOTE — Telephone Encounter (Signed)
 Orders Placed This Encounter  Procedures   Lipid Profile   CMP14+EGFR   CBC w/Diff/Platelet   PSA   HgB A1c   Thank you.

## 2024-08-14 ENCOUNTER — Encounter: Payer: Self-pay | Admitting: Family Medicine

## 2024-08-14 DIAGNOSIS — E7800 Pure hypercholesterolemia, unspecified: Secondary | ICD-10-CM

## 2024-08-14 NOTE — Telephone Encounter (Signed)
 Orders Placed This Encounter  Procedures   Lipid Profile   CMP14+EGFR   CBC w/Diff/Platelet   PSA   HgB A1c     Orders Placed This Encounter  Procedures   CT CARDIAC SCORING (SELF PAY ONLY)    Standing Status:   Future    Expiration Date:   08/14/2025    Preferred imaging location?:   [facility]

## 2024-08-17 LAB — CMP14+EGFR
ALT: 15 IU/L (ref 0–44)
AST: 16 IU/L (ref 0–40)
Albumin: 4.6 g/dL (ref 3.8–4.9)
Alkaline Phosphatase: 85 IU/L (ref 44–121)
BUN/Creatinine Ratio: 15 (ref 9–20)
BUN: 13 mg/dL (ref 6–24)
Bilirubin Total: 0.5 mg/dL (ref 0.0–1.2)
CO2: 24 mmol/L (ref 20–29)
Calcium: 9.4 mg/dL (ref 8.7–10.2)
Chloride: 100 mmol/L (ref 96–106)
Creatinine, Ser: 0.84 mg/dL (ref 0.76–1.27)
Globulin, Total: 2 g/dL (ref 1.5–4.5)
Glucose: 106 mg/dL — ABNORMAL HIGH (ref 70–99)
Potassium: 4.2 mmol/L (ref 3.5–5.2)
Sodium: 139 mmol/L (ref 134–144)
Total Protein: 6.6 g/dL (ref 6.0–8.5)
eGFR: 102 mL/min/1.73 (ref >59)

## 2024-08-17 LAB — CBC WITH DIFFERENTIAL/PLATELET
Basophils Absolute: 0 x10E3/uL (ref 0.0–0.2)
Basos: 0 %
EOS (ABSOLUTE): 0.1 x10E3/uL (ref 0.0–0.4)
Eos: 2 %
Hematocrit: 45.1 % (ref 37.5–51.0)
Hemoglobin: 14.5 g/dL (ref 13.0–17.7)
Immature Grans (Abs): 0 x10E3/uL (ref 0.0–0.1)
Immature Granulocytes: 0 %
Lymphocytes Absolute: 1.9 x10E3/uL (ref 0.7–3.1)
Lymphs: 28 %
MCH: 30.5 pg (ref 26.6–33.0)
MCHC: 32.2 g/dL (ref 31.5–35.7)
MCV: 95 fL (ref 79–97)
Monocytes Absolute: 0.5 x10E3/uL (ref 0.1–0.9)
Monocytes: 7 %
Neutrophils Absolute: 4.3 x10E3/uL (ref 1.4–7.0)
Neutrophils: 63 %
Platelets: 249 x10E3/uL (ref 150–450)
RBC: 4.75 x10E6/uL (ref 4.14–5.80)
RDW: 12.5 % (ref 11.6–15.4)
WBC: 6.8 x10E3/uL (ref 3.4–10.8)

## 2024-08-17 LAB — PSA: Prostate Specific Ag, Serum: 2.9 ng/mL (ref 0.0–4.0)

## 2024-08-22 ENCOUNTER — Ambulatory Visit (INDEPENDENT_AMBULATORY_CARE_PROVIDER_SITE_OTHER): Payer: Self-pay

## 2024-08-22 DIAGNOSIS — E7800 Pure hypercholesterolemia, unspecified: Secondary | ICD-10-CM

## 2024-08-23 ENCOUNTER — Ambulatory Visit: Payer: Self-pay | Admitting: Family Medicine

## 2024-08-23 NOTE — Progress Notes (Signed)
 Your lab work is within acceptable range and there are no concerning findings.   ?

## 2024-08-23 NOTE — Progress Notes (Signed)
 HI George Young your calcium  score was 13 thi s mean you are at increased CV risk. I would highly recommend we increase your atorvastatin  if you feel comfortable with that. I think have tolerated this one so far, but I would recommend we increase you dose. Let me know if you are ok with that.

## 2024-09-01 ENCOUNTER — Encounter: Payer: Self-pay | Admitting: Family Medicine

## 2024-09-04 ENCOUNTER — Other Ambulatory Visit: Payer: Self-pay | Admitting: Family Medicine

## 2024-09-04 DIAGNOSIS — E7800 Pure hypercholesterolemia, unspecified: Secondary | ICD-10-CM

## 2024-09-04 MED ORDER — ATORVASTATIN CALCIUM 40 MG PO TABS: 40.0000 mg | ORAL_TABLET | Freq: Every day | ORAL | 3 refills | Status: AC

## 2024-09-07 ENCOUNTER — Ambulatory Visit (INDEPENDENT_AMBULATORY_CARE_PROVIDER_SITE_OTHER): Admitting: Family Medicine

## 2024-09-07 ENCOUNTER — Encounter: Payer: Self-pay | Admitting: Family Medicine

## 2024-09-07 VITALS — BP 130/78 | HR 76 | Ht 66.0 in | Wt 147.1 lb

## 2024-09-07 DIAGNOSIS — Z0000 Encounter for general adult medical examination without abnormal findings: Secondary | ICD-10-CM

## 2024-09-07 DIAGNOSIS — I8393 Asymptomatic varicose veins of bilateral lower extremities: Secondary | ICD-10-CM | POA: Insufficient documentation

## 2024-09-07 DIAGNOSIS — R7301 Impaired fasting glucose: Secondary | ICD-10-CM

## 2024-09-07 DIAGNOSIS — L821 Other seborrheic keratosis: Secondary | ICD-10-CM

## 2024-09-07 DIAGNOSIS — R351 Nocturia: Secondary | ICD-10-CM

## 2024-09-07 DIAGNOSIS — E785 Hyperlipidemia, unspecified: Secondary | ICD-10-CM | POA: Diagnosis not present

## 2024-09-07 DIAGNOSIS — N401 Enlarged prostate with lower urinary tract symptoms: Secondary | ICD-10-CM | POA: Insufficient documentation

## 2024-09-07 DIAGNOSIS — R931 Abnormal findings on diagnostic imaging of heart and coronary circulation: Secondary | ICD-10-CM | POA: Insufficient documentation

## 2024-09-07 DIAGNOSIS — Z1211 Encounter for screening for malignant neoplasm of colon: Secondary | ICD-10-CM

## 2024-09-07 MED ORDER — DUTASTERIDE 0.5 MG PO CAPS: 0.5000 mg | ORAL_CAPSULE | Freq: Every day | ORAL | 1 refills | Status: DC

## 2024-09-07 MED ORDER — TAMSULOSIN HCL 0.4 MG PO CAPS: 0.4000 mg | ORAL_CAPSULE | Freq: Every day | ORAL | 1 refills | Status: DC

## 2024-09-07 NOTE — Assessment & Plan Note (Addendum)
 Lab Results  Component Value Date   HGBA1C 6.2 07/01/2024   Stable, discussed can consider starting metformin or continue to work on diet and lifestyle.

## 2024-09-07 NOTE — Progress Notes (Unsigned)
 Established Patient Office Visit  Subjective  Patient ID: George Young, male    DOB: Dec 28, 1966  Age: 58 y.o. MRN: [id number]  Chief Complaint  Patient presents with   Annual Exam    HPI  Impaired fasting glucose-no increased thirst or urination. No symptoms consistent with hypoglycemia.  Discussed the use of AI scribe software for clinical note transcription with the patient, who gave verbal consent to proceed.  History of Present Illness George Young is a 58 year old male who presents for evaluation of coronary artery disease risk and prostate symptoms.  Coronary artery disease risk assessment - Coronary CT scan with a score of 13, raising concern for coronary artery disease risk - Currently taking a statin medication - Family history of heart disease: mother with high cholesterol and a pacemaker  Lower urinary tract symptoms - Nocturia, waking at least twice nightly to urinate - Occasional urinary hesitancy - Attributes some nocturia to increased water intake in the evenings - Family history of prostate issues: father and cousin with history of prostate surgery  Cutaneous lesion of the shoulder - Mole on the shoulder has become more raised and dry over time - Previously evaluated when it was flat - Current concerns include irritation from clothing and seatbelts  Varicose veins - History of varicose veins - Brother with severe varicose veins - Considering preventive measures     Narrative & Impression  CLINICAL DATA:  Cardiovascular Disease Risk stratification   EXAM: Coronary Calcium  Score   TECHNIQUE: A gated, non-contrast computed tomography scan of the heart was performed using 3mm slice thickness. Axial images were analyzed on a dedicated workstation. Calcium  scoring of the coronary arteries was performed using the Agatston method.   FINDINGS: Coronary arteries: Normal origins.   Coronary Calcium  Score:   Left main: 1   Left anterior descending  artery: 10   Left circumflex artery: 2   Right coronary artery: 0   Total: 13   Percentile: 65   Pericardium: Normal.   Ascending Aorta: Normal caliber.   Non-cardiac: See separate report from [facility].   IMPRESSION: Coronary calcium  score of 13. This was 69 percentile for age-, race-, and sex-matched controls.       ROS    Objective:     BP 130/78   Pulse 76   Ht 5' 6 (1.676 m)   Wt 147 lb 1.3 oz (66.7 kg)   SpO2 95%   BMI 23.74 kg/m     Physical Exam Vitals and nursing note reviewed. Exam conducted with a chaperone present.  Constitutional:      Appearance: Normal appearance.  HENT:     Head: Normocephalic and atraumatic.     Right Ear: External ear normal.     Left Ear: External ear normal.  Eyes:     Conjunctiva/sclera: Conjunctivae normal.  Cardiovascular:     Rate and Rhythm: Normal rate and regular rhythm.  Pulmonary:     Effort: Pulmonary effort is normal.     Breath sounds: Normal breath sounds.  Genitourinary:    Prostate: Enlarged. Not tender and no nodules present.     Rectum: No mass or tenderness.  Skin:    General: Skin is warm and dry.  Neurological:     Mental Status: He is alert and oriented to person, place, and time.  Psychiatric:        Mood and Affect: Mood normal.        Behavior: Behavior normal.  No results found for any visits on 09/07/24.     The 10-year ASCVD risk score (Arnett DK, et al., 2019) is: 6.1%* (Cholesterol units were assumed)    Assessment & Plan:   Problem List Items Addressed This Visit       Cardiovascular and Mediastinum   Asymptomatic varicose veins of both lower extremities     Endocrine   IFG (impaired fasting glucose)   Lab Results  Component Value Date   HGBA1C 6.2 07/01/2024   Stable, discussed can consider starting metformin or continue to work on diet and lifestyle.          Other   Hyperlipidemia LDL goal <100   BPH associated with nocturia   Relevant  Medications   tamsulosin  (FLOMAX ) 0.4 MG CAPS capsule   dutasteride  (AVODART ) 0.5 MG capsule   Abnormal screening cardiac CT   Continue 40mg  Atorvastatin .  Continue healthy lifestyle.       Other Visit Diagnoses       Wellness examination    -  Primary     Screening for colon cancer         Seborrheic keratosis          Assessment and Plan Assessment & Plan Benign prostatic hyperplasia with lower urinary tract symptoms Symptoms suggest mild to moderate BPH. Family history indicates possible genetic predisposition. - Administer American U[employer] Association questionnaire to assess symptom severity. - Prescribe medication, finasteride ,  to shrink prostate gland, expect effect in six months. - Prescribe Flomax  for immediate symptom relief. - Start one medication first to monitor side effects, then add second medication. - AUA score of 7, rates at unhappy in regards to symptoms.     RO-AUA SYMPTOM     Row Name 09/07/24 1700         During the last Month   Sensation of Bladder not Empty Less than 1 time in 5     Urinate<2 hours after last Less than half the time     Mult. stop/start when voiding Less than 1 time in 5     Difficult to postpone voiding Not at all     Weak urinary stream Not at all     Push/strain to begin urination Less than 1 time in 5     Times per night up to urinate Less than half the time       OTHER   Total Score 7         Atherosclerotic heart disease of native coronary artery without angina Coronary CT score indicates above-average cardiovascular risk. Aggressive LDL reduction necessary to manage risk. Statins can reduce LDL and prevent cardiovascular events. - Increase statin dosage for better LDL control. - Discuss long-term benefits of statin therapy in reducing cardiovascular risk.  Mild varicose veins of lower extremity Family history of significant varicose vein progression. Preventive measures discussed. - Recommend wearing 25-30 mmHg  compression stockings, especially in cooler months.  Seborrheic keratosis Lesion on right shoulder is benign but bothersome due to location. - Performed liquid nitrogen cryotherapy. - Advise applying Vaseline post-peeling. - Instruct to leave area uncovered until peeling, then cover if necessary.   Cryotherapy Procedure Note  Pre-operative Diagnosis: seborrheic keratosis  Post-operative Diagnosis: same  Locations: right top of shoulder   Indications: irritation from clothing.   Anesthesia: not required without added sodium bicarbonate  Procedure Details  Patient informed of risks (permanent scarring, infection, light or dark discoloration, bleeding, infection, weakness, numbness and recurrence of the lesion) and  benefits of the procedure and verbal informed consent obtained.  The areas are treated with liquid nitrogen therapy, frozen until ice ball extended 1-2 mm beyond lesion, allowed to thaw, and treated again. The patient tolerated procedure well.  The patient was instructed on post-op care, warned that there may be blister formation, redness and pain. Recommend OTC analgesia as needed for pain.  Condition: Stable  Complications: none.  Plan: 1. Instructed to keep the area dry and covered for 24-48h and clean thereafter. 2. Warning signs of infection were reviewed.   3. Recommended that the patient use OTC acetaminophen as needed for pain.  4. Return PRN.    Return in about 1 year (around 09/07/2025) for cpe.    Dorothyann Byars, MD

## 2024-09-07 NOTE — Assessment & Plan Note (Signed)
 Continue 40mg  Atorvastatin .  Continue healthy lifestyle.

## 2024-09-08 ENCOUNTER — Other Ambulatory Visit: Payer: Self-pay | Admitting: Family Medicine

## 2024-09-08 ENCOUNTER — Encounter: Payer: Self-pay | Admitting: Family Medicine

## 2024-09-08 DIAGNOSIS — N401 Enlarged prostate with lower urinary tract symptoms: Secondary | ICD-10-CM

## 2024-09-08 MED ORDER — TAMSULOSIN HCL 0.4 MG PO CAPS
0.4000 mg | ORAL_CAPSULE | Freq: Every day | ORAL | 1 refills | Status: AC
Start: 2024-09-08 — End: ?

## 2024-09-08 NOTE — Telephone Encounter (Signed)
 Called pt and asked him about the Sulfonamide allergy/intolerance. He stated that he doesn't have an allergy to this. He would like to check on the cost of the Tamsulosin  before we resend this.   He will either call or send a mychart about whether or not he would like us  to send this in for him.

## 2024-09-12 ENCOUNTER — Ambulatory Visit: Payer: Self-pay | Admitting: Family Medicine

## 2024-10-11 ENCOUNTER — Encounter: Payer: Self-pay | Admitting: Family Medicine

## 2024-10-17 ENCOUNTER — Telehealth: Payer: Self-pay

## 2024-10-17 NOTE — Telephone Encounter (Signed)
 He is definitely okay to get the Prevnar 20 we can do that here or he can have it done at his pharmacy.

## 2024-10-17 NOTE — Telephone Encounter (Signed)
 Patient would like to know if it is ok for them to get the pneumonia shot, patient was seen for Physical on 09/07/24, thanks.

## 2024-10-18 ENCOUNTER — Ambulatory Visit

## 2024-10-18 NOTE — Telephone Encounter (Signed)
 Attempted call to patient. Left a voice mail message requesting a return call.

## 2024-10-20 ENCOUNTER — Ambulatory Visit

## 2024-10-20 VITALS — BP 135/61 | HR 60 | Resp 18

## 2024-10-20 DIAGNOSIS — Z23 Encounter for immunization: Secondary | ICD-10-CM

## 2024-11-15 ENCOUNTER — Encounter: Payer: Self-pay | Admitting: Family Medicine

## 2024-11-17 ENCOUNTER — Other Ambulatory Visit: Payer: Self-pay | Admitting: *Deleted

## 2024-11-17 DIAGNOSIS — N401 Enlarged prostate with lower urinary tract symptoms: Secondary | ICD-10-CM

## 2024-11-17 MED ORDER — DUTASTERIDE 0.5 MG PO CAPS
0.5000 mg | ORAL_CAPSULE | Freq: Every day | ORAL | 1 refills | Status: AC
Start: 2024-11-17 — End: ?

## 2025-09-13 ENCOUNTER — Encounter: Admitting: Family Medicine
